# Patient Record
Sex: Male | Born: 1935 | Race: White | Hispanic: No | Marital: Married | Smoking: Former smoker
Health system: Southern US, Community
[De-identification: ages and names within clinical notes are randomized; demographics above are authoritative.]

## PROBLEM LIST (undated history)

## (undated) DIAGNOSIS — I2510 Atherosclerotic heart disease of native coronary artery without angina pectoris: Secondary | ICD-10-CM

## (undated) DIAGNOSIS — E039 Hypothyroidism, unspecified: Secondary | ICD-10-CM

## (undated) DIAGNOSIS — E119 Type 2 diabetes mellitus without complications: Secondary | ICD-10-CM

## (undated) DIAGNOSIS — N400 Enlarged prostate without lower urinary tract symptoms: Secondary | ICD-10-CM

## (undated) DIAGNOSIS — I214 Non-ST elevation (NSTEMI) myocardial infarction: Secondary | ICD-10-CM

## (undated) DIAGNOSIS — N200 Calculus of kidney: Secondary | ICD-10-CM

## (undated) DIAGNOSIS — R011 Cardiac murmur, unspecified: Secondary | ICD-10-CM

## (undated) HISTORY — PX: CORONARY ARTERY BYPASS GRAFT: SHX141

## (undated) HISTORY — PX: TONSILLECTOMY: SUR1361

---

## 2001-01-23 ENCOUNTER — Ambulatory Visit (HOSPITAL_COMMUNITY): Admission: RE | Admit: 2001-01-23 | Discharge: 2001-01-23 | Payer: Self-pay | Admitting: Ophthalmology

## 2001-01-23 ENCOUNTER — Encounter: Payer: Self-pay | Admitting: Ophthalmology

## 2021-04-14 ENCOUNTER — Emergency Department (HOSPITAL_BASED_OUTPATIENT_CLINIC_OR_DEPARTMENT_OTHER)
Admission: EM | Admit: 2021-04-14 | Discharge: 2021-04-15 | Disposition: A | Discharge status: Home / Self Care | Payer: Medicare Other | Attending: Emergency Medicine | Admitting: Emergency Medicine

## 2021-04-14 ENCOUNTER — Other Ambulatory Visit: Payer: Self-pay

## 2021-04-14 ENCOUNTER — Emergency Department (HOSPITAL_BASED_OUTPATIENT_CLINIC_OR_DEPARTMENT_OTHER): Payer: Medicare Other

## 2021-04-14 ENCOUNTER — Encounter (HOSPITAL_BASED_OUTPATIENT_CLINIC_OR_DEPARTMENT_OTHER): Payer: Self-pay | Admitting: *Deleted

## 2021-04-14 DIAGNOSIS — E119 Type 2 diabetes mellitus without complications: Secondary | ICD-10-CM | POA: Insufficient documentation

## 2021-04-14 DIAGNOSIS — R42 Dizziness and giddiness: Secondary | ICD-10-CM | POA: Insufficient documentation

## 2021-04-14 DIAGNOSIS — E039 Hypothyroidism, unspecified: Secondary | ICD-10-CM | POA: Diagnosis not present

## 2021-04-14 DIAGNOSIS — F17210 Nicotine dependence, cigarettes, uncomplicated: Secondary | ICD-10-CM | POA: Diagnosis not present

## 2021-04-14 HISTORY — DX: Non-ST elevation (NSTEMI) myocardial infarction: I21.4

## 2021-04-14 HISTORY — DX: Hypothyroidism, unspecified: E03.9

## 2021-04-14 HISTORY — DX: Cardiac murmur, unspecified: R01.1

## 2021-04-14 HISTORY — DX: Calculus of kidney: N20.0

## 2021-04-14 HISTORY — DX: Atherosclerotic heart disease of native coronary artery without angina pectoris: I25.10

## 2021-04-14 HISTORY — DX: Benign prostatic hyperplasia without lower urinary tract symptoms: N40.0

## 2021-04-14 HISTORY — DX: Type 2 diabetes mellitus without complications: E11.9

## 2021-04-14 LAB — CBC WITH DIFFERENTIAL/PLATELET
Abs Immature Granulocytes: 0.02 10*3/uL (ref 0.00–0.07)
Basophils Absolute: 0 10*3/uL (ref 0.0–0.1)
Basophils Relative: 1 %
Eosinophils Absolute: 0.1 10*3/uL (ref 0.0–0.5)
Eosinophils Relative: 2 %
HCT: 33.8 % — ABNORMAL LOW (ref 39.0–52.0)
Hemoglobin: 11.4 g/dL — ABNORMAL LOW (ref 13.0–17.0)
Immature Granulocytes: 0 %
Lymphocytes Relative: 21 %
Lymphs Abs: 1.1 10*3/uL (ref 0.7–4.0)
MCH: 30.5 pg (ref 26.0–34.0)
MCHC: 33.7 g/dL (ref 30.0–36.0)
MCV: 90.4 fL (ref 80.0–100.0)
Monocytes Absolute: 0.4 10*3/uL (ref 0.1–1.0)
Monocytes Relative: 8 %
Neutro Abs: 3.6 10*3/uL (ref 1.7–7.7)
Neutrophils Relative %: 68 %
Platelets: 175 10*3/uL (ref 150–400)
RBC: 3.74 MIL/uL — ABNORMAL LOW (ref 4.22–5.81)
RDW: 14.1 % (ref 11.5–15.5)
WBC: 5.3 10*3/uL (ref 4.0–10.5)
nRBC: 0 % (ref 0.0–0.2)

## 2021-04-14 LAB — BASIC METABOLIC PANEL
Anion gap: 7 (ref 5–15)
BUN: 40 mg/dL — ABNORMAL HIGH (ref 8–23)
CO2: 23 mmol/L (ref 22–32)
Calcium: 8.7 mg/dL — ABNORMAL LOW (ref 8.9–10.3)
Chloride: 107 mmol/L (ref 98–111)
Creatinine, Ser: 1.35 mg/dL — ABNORMAL HIGH (ref 0.61–1.24)
GFR, Estimated: 50 mL/min — ABNORMAL LOW (ref >60)
Glucose, Bld: 174 mg/dL — ABNORMAL HIGH (ref 70–99)
Potassium: 3.9 mmol/L (ref 3.5–5.1)
Sodium: 137 mmol/L (ref 135–145)

## 2021-04-14 LAB — TROPONIN I (HIGH SENSITIVITY): Troponin I (High Sensitivity): 6 ng/L (ref <18)

## 2021-04-14 LAB — D-DIMER, QUANTITATIVE: D-Dimer, Quant: 1.48 ug/mL-FEU — ABNORMAL HIGH (ref 0.00–0.50)

## 2021-04-14 MED ORDER — IOHEXOL 350 MG/ML SOLN
75.0000 mL | Freq: Once | INTRAVENOUS | Status: AC | PRN
  Administered 2021-04-14: 75 mL via INTRAVENOUS

## 2021-04-14 NOTE — ED Provider Notes (Signed)
[facility] EMERGENCY DEPARTMENT Provider Note  CSN: [id number] Arrival date & time: 04/14/21 1955    History Chief Complaint  Patient presents with   Dizziness    Steve Mcgrath is a 87 y.o. male with history of CAD s/p CABG 20+ years ago reports this evening after dinner when he was getting up be began to feel lightheaded and off balance. He does not describe room spinning. Symptoms lasted for a few minutes and then resolved. He has not felt lightheaded since then and was able to walk without assistance on arrival but still does not feel quite right. He has not had any recent fever, CP, SOB, N/V/D. He does reports a mild aching pain in his R medial upper leg a few days ago that has resolved.    Past Medical History:  Diagnosis Date   CAD (coronary artery disease)    DM (diabetes mellitus) (HCC)    Enlarged prostate    Hypothyroid    Kidney stones    MI, acute, non ST segment elevation (HCC)    Murmur     Past Surgical History:  Procedure Laterality Date   CORONARY ARTERY BYPASS GRAFT     TONSILLECTOMY      No family history on file.  Social History   Tobacco Use   Smoking status: Former    Types: Cigarettes  Substance Use Topics   Alcohol use: Never     Home Medications Prior to Admission medications   Not on File     Allergies    Patient has no known allergies.   Review of Systems   Review of Systems A comprehensive review of systems was completed and negative except as noted in HPI.    Physical Exam BP (!) 135/48   Pulse 66   Temp 98.3 F (36.8 C) (Oral)   Resp (!) 23   Ht 5\' 6"  (1.676 m)   Wt 63.5 kg   SpO2 95%   BMI 22.60 kg/m   Physical Exam Vitals and nursing note reviewed.  Constitutional:      Appearance: Normal appearance.  HENT:     Head: Normocephalic and atraumatic.     Nose: Nose normal.     Mouth/Throat:     Mouth: Mucous membranes are moist.  Eyes:     Extraocular Movements: Extraocular movements intact.      Conjunctiva/sclera: Conjunctivae normal.     Comments: No nystagmus  Cardiovascular:     Rate and Rhythm: Normal rate.  Pulmonary:     Effort: Pulmonary effort is normal.     Breath sounds: Normal breath sounds.  Abdominal:     General: Abdomen is flat.     Palpations: Abdomen is soft.     Tenderness: There is no abdominal tenderness.  Musculoskeletal:        General: No swelling. Normal range of motion.     Cervical back: Neck supple.     Right lower leg: No edema.     Left lower leg: No edema.  Skin:    General: Skin is warm and dry.  Neurological:     General: No focal deficit present.     Mental Status: He is alert and oriented to person, place, and time.     Cranial Nerves: No cranial nerve deficit.     Sensory: No sensory deficit.     Motor: No weakness.     Coordination: Coordination normal.     Gait: Gait normal.  Psychiatric:  Mood and Affect: Mood normal.     ED Results / Procedures / Treatments   Labs (all labs ordered are listed, but only abnormal results are displayed) Labs Reviewed  BASIC METABOLIC PANEL - Abnormal; Notable for the following components:      Result Value   Glucose, Bld 174 (*)    BUN 40 (*)    Creatinine, Ser 1.35 (*)    Calcium 8.7 (*)    GFR, Estimated 50 (*)    All other components within normal limits  CBC WITH DIFFERENTIAL/PLATELET - Abnormal; Notable for the following components:   RBC 3.74 (*)    Hemoglobin 11.4 (*)    HCT 33.8 (*)    All other components within normal limits  D-DIMER, QUANTITATIVE - Abnormal; Notable for the following components:   D-Dimer, Quant 1.48 (*)    All other components within normal limits  URINALYSIS, ROUTINE W REFLEX MICROSCOPIC  TROPONIN I (HIGH SENSITIVITY)  TROPONIN I (HIGH SENSITIVITY)    EKG EKG Interpretation  Date/Time:  Tuesday April 14 2021 21:58:32 EDT Ventricular Rate:  60 PR Interval:  167 QRS Duration: 108 QT Interval:  418 QTC Calculation: 418 R  Axis:   71 Text Interpretation: Sinus rhythm Minimal ST depression, lateral leads No significant change since last tracing Confirmed by Susy Frizzle [phone]) on 04/14/2021 10:39:35 PM   Radiology CT HEAD WO CONTRAST ( )  Result Date: 04/14/2021 CLINICAL DATA:  Dizziness EXAM: CT HEAD WITHOUT CONTRAST TECHNIQUE: Contiguous axial images were obtained from the base of the skull through the vertex without intravenous contrast. COMPARISON:  None. FINDINGS: Brain: No evidence of acute infarction, hemorrhage, hydrocephalus, extra-axial collection or mass lesion/mass effect. Mild cortical atrophy. Subcortical white matter and periventricular small vessel ischemic changes. Vascular: Intracranial atherosclerosis. Skull: Normal. Negative for fracture or focal lesion. Sinuses/Orbits: The visualized paranasal sinuses are essentially clear. The mastoid air cells are unopacified. Other: None. IMPRESSION: No evidence of acute intracranial abnormality. Atrophy with small vessel ischemic changes. Electronically Signed   By: Charline Bills M.D.   On: 04/14/2021 20:42   CT Angio Chest PE W/Cm &/Or Wo Cm  Result Date: 04/14/2021 CLINICAL DATA:  Positive D-dimer, dizziness and weakness. EXAM: CT ANGIOGRAPHY CHEST WITH CONTRAST TECHNIQUE: Multidetector CT imaging of the chest was performed using the standard protocol during bolus administration of intravenous contrast. Multiplanar CT image reconstructions and MIPs were obtained to evaluate the vascular anatomy. CONTRAST:  4mL OMNIPAQUE IOHEXOL 350 MG/ML SOLN COMPARISON:  None. FINDINGS: Cardiovascular: Satisfactory opacification of the pulmonary arteries to the segmental level. No evidence of pulmonary embolism. The heart is mildly enlarged. There is no pericardial effusion. Patient is status post cardiac surgery. The thoracic aorta is ectatic. There is calcified atherosclerotic disease throughout the aorta. Mediastinum/Nodes: There are no enlarged mediastinal or hilar  lymph nodes identified. There is a moderate-sized hiatal hernia. The visualized esophagus is nondilated. Lungs/Pleura: There are mild emphysematous changes bilaterally. There is pleuroparenchymal scarring in both lung apices. There is no focal lung infiltrate. There is no pleural effusion or pneumothorax. Upper Abdomen: No acute abnormality. Musculoskeletal: Sternotomy wires are present. Degenerative changes affect the spine. Review of the MIP images confirms the above findings. IMPRESSION: 1. No evidence for pulmonary embolism. 2. Mild cardiomegaly. 3. Moderate-sized hiatal hernia. 4. Aortic Atherosclerosis (ICD10-I70.0) and Emphysema (ICD10-J43.9). Electronically Signed   By: Darliss Cheney M.D.   On: 04/14/2021 23:22   US Venous Img Lower Right (DVT Study)  Result Date: 04/14/2021 CLINICAL DATA:  Right leg pain.  EXAM: RIGHT LOWER EXTREMITY VENOUS DOPPLER ULTRASOUND TECHNIQUE: Gray-scale sonography with compression, as well as color and duplex ultrasound, were performed to evaluate the deep venous system(s) from the level of the common femoral vein through the popliteal and proximal calf veins. COMPARISON:  None. FINDINGS: VENOUS Normal compressibility of the common femoral, superficial femoral, and popliteal veins, as well as the visualized calf veins. Visualized portions of profunda femoral vein and great saphenous vein unremarkable. No filling defects to suggest DVT on grayscale or color Doppler imaging. Doppler waveforms show normal direction of venous flow, normal respiratory plasticity and response to augmentation. Limited views of the contralateral common femoral vein are unremarkable. OTHER None. Limitations: none IMPRESSION: Negative. Electronically Signed   By: Aram Candela M.D.   On: 04/14/2021 23:32    Procedures Procedures  Medications Ordered in the ED Medications  iohexol (OMNIPAQUE) 350 MG/ML injection 75 mL (75 mLs Intravenous Contrast Given 04/14/21 2255)     MDM  Rules/Calculators/A&P MDM Patient with unusual dizziness/lightheadedness. Not clearly vertiginous and has essentially resolved now. He does not have any focal neuro findings. Will check labs, send for CT and reassess.   ED Course  I have reviewed the triage vital signs and the nursing notes.  Pertinent labs & imaging results that were available during my care of the patient were reviewed by me and considered in my medical decision making (see chart for details).  Clinical Course as of 04/14/21 2335  Tue Apr 14, 2021  2137 CT head is negative.  [CS]  2224 CBC is unremarkable.  [CS]  2225 BMP is unremarkable. First Trop neg [CS]  2225 Age adjusted d-dimer is elevated. Given R thigh pain and sudden dizziness today, will check [place] and CTA. Patient and family at bedside amenable to plan.  [CS]  2325 CTA is neg for PE.  [CS]  2330 Doppler is pending. Patient is eager to go home if it is negative for DVT. He has remained asymptomatic and is not interested in admission. He understands that his dizziness could be from a central source and that if his symptoms return he should come back for reassessment.  [CS]    Clinical Course User Index [CS] Pollyann Savoy, MD    Final Clinical Impression(s) / ED Diagnoses Final diagnoses:  Dizziness    Rx / DC Orders ED Discharge Orders     None        Pollyann Savoy, MD 04/14/21 2335

## 2021-04-14 NOTE — ED Notes (Signed)
Pt laid flat for orthostatic vitals at [phone]

## 2021-04-14 NOTE — ED Triage Notes (Addendum)
Sudden onset of dizziness and weakness x 1 hr ago , wife denies slurred speech or unsteady gait , pt reports dizziness and nausea with movt

## 2021-08-03 ENCOUNTER — Other Ambulatory Visit (HOSPITAL_BASED_OUTPATIENT_CLINIC_OR_DEPARTMENT_OTHER): Payer: Self-pay

## 2021-08-03 ENCOUNTER — Emergency Department (HOSPITAL_BASED_OUTPATIENT_CLINIC_OR_DEPARTMENT_OTHER)
Admission: EM | Admit: 2021-08-03 | Discharge: 2021-08-03 | Disposition: A | Discharge status: Home / Self Care | Payer: Medicare Other | Attending: Emergency Medicine | Admitting: Emergency Medicine

## 2021-08-03 ENCOUNTER — Other Ambulatory Visit: Payer: Self-pay

## 2021-08-03 ENCOUNTER — Encounter (HOSPITAL_BASED_OUTPATIENT_CLINIC_OR_DEPARTMENT_OTHER): Payer: Self-pay

## 2021-08-03 DIAGNOSIS — U071 COVID-19: Secondary | ICD-10-CM

## 2021-08-03 DIAGNOSIS — R059 Cough, unspecified: Secondary | ICD-10-CM | POA: Diagnosis present

## 2021-08-03 LAB — RESP PANEL BY RT-PCR (FLU A&B, COVID) ARPGX2
Influenza A by PCR: NEGATIVE
Influenza B by PCR: NEGATIVE
SARS Coronavirus 2 by RT PCR: POSITIVE — AB

## 2021-08-03 MED ORDER — CARESTART COVID-19 HOME TEST VI KIT
PACK | 0 refills | Status: DC
  Filled 2021-08-03: qty 4, 4d supply, fill #0

## 2021-08-03 MED ORDER — MOLNUPIRAVIR 200 MG PO CAPS
ORAL_CAPSULE | ORAL | 0 refills | Status: DC
  Filled 2021-08-03: qty 40, 5d supply, fill #0

## 2021-08-03 MED ORDER — MOLNUPIRAVIR EUA 200MG CAPSULE
4.0000 | ORAL_CAPSULE | Freq: Two times a day (BID) | ORAL | 0 refills | Status: AC
  Filled 2021-08-03: qty 40, 5d supply, fill #0

## 2021-08-03 NOTE — Discharge Instructions (Addendum)
It was our pleasure to provide your ER care today - we hope that you feel better.  Your covid test is positive - see attached info.  Drink plenty of fluids/stay well hydrated. Stay active, take full and deep breaths. Take acetaminophen or ibuprofen as need for fever or body aches. Take molnupiravir as prescribed.   Follow up with primary care doctor in two weeks if symptoms fail to improve/resolve.  Return to ER if worse, new symptoms, increased trouble breathing, or other emergency concern.

## 2021-08-03 NOTE — ED Triage Notes (Signed)
Pt c/o fever 89f, chills, nausea, cough since yesterday. Last tylenol last night.

## 2021-08-03 NOTE — ED Provider Notes (Signed)
[facility] EMERGENCY DEPARTMENT Provider Note   CSN: [id number] Arrival date & time: 08/03/21  2751     History  Chief Complaint  Patient presents with   Fever    MADDYX WIECK is a 88 y.o. male.  Patient c/o non prod cough, fever, congestion, since yesterday. Symptoms acute  onset, moderate, persistent. No chest pain or discomfort. No sob. No sore throat or trouble swallowing. No severe headaches. No neck pain or stiffness. No abd pain or nvd. No gu c/o. Is eating/drinking. No specific known ill contacts. Has had 3 covid shots.   The history is provided by the patient, a relative and medical records.  Fever Associated symptoms: congestion and cough   Associated symptoms: no chest pain, no confusion, no headaches, no rash and no sore throat       Home Medications Prior to Admission medications   Not on File      Allergies    Patient has no known allergies.    Review of Systems   Review of Systems  Constitutional:  Positive for fever.  HENT:  Positive for congestion. Negative for sore throat.   Eyes:  Negative for redness.  Respiratory:  Positive for cough. Negative for shortness of breath.   Cardiovascular:  Negative for chest pain and leg swelling.  Gastrointestinal:  Negative for abdominal pain.  Genitourinary:  Negative for flank pain.  Musculoskeletal:  Negative for neck pain and neck stiffness.  Skin:  Negative for rash.  Neurological:  Negative for headaches.  Hematological:  Does not bruise/bleed easily.  Psychiatric/Behavioral:  Negative for confusion.    Physical Exam Updated Vital Signs BP (!) 140/53 (BP Location: Right Arm)    Pulse 69    Temp 99.3 F (37.4 C) (Oral)    Resp 20    Ht 1.626 m (5\' 4" )    Wt 62.6 kg    SpO2 95%    BMI 23.69 kg/m  Physical Exam Vitals and nursing note reviewed.  Constitutional:      Appearance: Normal appearance. He is well-developed.  HENT:     Head: Atraumatic.     Nose: Congestion present.      Mouth/Throat:     Mouth: Mucous membranes are moist.     Pharynx: Oropharynx is clear.  Eyes:     General: No scleral icterus.    Conjunctiva/sclera: Conjunctivae normal.  Neck:     Trachea: No tracheal deviation.     Comments: No stiffness or rigidity Cardiovascular:     Rate and Rhythm: Normal rate and regular rhythm.     Pulses: Normal pulses.     Heart sounds: Normal heart sounds. No murmur heard.   No friction rub. No gallop.  Pulmonary:     Effort: Pulmonary effort is normal. No accessory muscle usage or respiratory distress.     Breath sounds: Normal breath sounds.  Abdominal:     General: Bowel sounds are normal. There is no distension.     Palpations: Abdomen is soft.     Tenderness: There is no abdominal tenderness.  Genitourinary:    Comments: No cva tenderness. Musculoskeletal:        General: No swelling.     Cervical back: Normal range of motion and neck supple. No rigidity.  Skin:    General: Skin is warm and dry.     Findings: No rash.  Neurological:     Mental Status: He is alert.     Comments: Alert, speech clear.  Psychiatric:        Mood and Affect: Mood normal.    ED Results / Procedures / Treatments   Labs (all labs ordered are listed, but only abnormal results are displayed) Labs Reviewed  RESP PANEL BY RT-PCR (FLU A&B, COVID) ARPGX2 - Abnormal; Notable for the following components:      Result Value   SARS Coronavirus 2 by RT PCR POSITIVE (*)    All other components within normal limits    EKG None  Radiology No results found.  Procedures Procedures    Medications Ordered in ED Medications - No data to display  ED Course/ Medical Decision Making/ A&P                           Medical Decision Making  Labs sent.   Reviewed nursing notes and prior charts for additional history. External reports reviewed. Additional hx from family.  Labs  reviewed/interpreted by me - covid positive.   LENTON GENDREAU was evaluated in Emergency  Department on 08/03/2021 for the symptoms described in the history of present illness. He was evaluated in the context of the global COVID-19 pandemic, which necessitated consideration that the patient might be at risk for infection with the SARS-CoV-2 virus that causes COVID-19. Institutional protocols and algorithms that pertain to the evaluation of patients at risk for COVID-19 are in a state of rapid change based on information released by regulatory bodies including the CDC and federal and state organizations. These policies and algorithms were followed during the patient's care in the ED.  Pt is breathing comfortably, pulse ox 95% room air. Has been vaccinated w booster x 1.    Will give rx monupiravir.   Return precautions provided.           Final Clinical Impression(s) / ED Diagnoses Final diagnoses:  None    Rx / DC Orders ED Discharge Orders     None         Cathren Laine, MD 08/03/21 1231

## 2022-11-22 ENCOUNTER — Encounter (HOSPITAL_BASED_OUTPATIENT_CLINIC_OR_DEPARTMENT_OTHER): Payer: Self-pay | Admitting: Urology

## 2022-11-22 ENCOUNTER — Other Ambulatory Visit: Payer: Self-pay

## 2022-11-22 ENCOUNTER — Emergency Department (HOSPITAL_BASED_OUTPATIENT_CLINIC_OR_DEPARTMENT_OTHER): Payer: Medicare Other

## 2022-11-22 ENCOUNTER — Inpatient Hospital Stay (HOSPITAL_BASED_OUTPATIENT_CLINIC_OR_DEPARTMENT_OTHER)
Admission: EM | Admit: 2022-11-22 | Discharge: 2022-11-29 | DRG: 981 | Disposition: A | Discharge status: Home Health | Payer: Medicare Other | Attending: Internal Medicine | Admitting: Internal Medicine

## 2022-11-22 DIAGNOSIS — I350 Nonrheumatic aortic (valve) stenosis: Secondary | ICD-10-CM | POA: Diagnosis present

## 2022-11-22 DIAGNOSIS — H3530 Unspecified macular degeneration: Secondary | ICD-10-CM | POA: Diagnosis present

## 2022-11-22 DIAGNOSIS — R011 Cardiac murmur, unspecified: Secondary | ICD-10-CM | POA: Diagnosis present

## 2022-11-22 DIAGNOSIS — E878 Other disorders of electrolyte and fluid balance, not elsewhere classified: Secondary | ICD-10-CM | POA: Diagnosis present

## 2022-11-22 DIAGNOSIS — Z79890 Hormone replacement therapy (postmenopausal): Secondary | ICD-10-CM

## 2022-11-22 DIAGNOSIS — E785 Hyperlipidemia, unspecified: Secondary | ICD-10-CM | POA: Diagnosis present

## 2022-11-22 DIAGNOSIS — Z7982 Long term (current) use of aspirin: Secondary | ICD-10-CM

## 2022-11-22 DIAGNOSIS — D62 Acute posthemorrhagic anemia: Secondary | ICD-10-CM | POA: Diagnosis not present

## 2022-11-22 DIAGNOSIS — Z79899 Other long term (current) drug therapy: Secondary | ICD-10-CM

## 2022-11-22 DIAGNOSIS — N1831 Chronic kidney disease, stage 3a: Secondary | ICD-10-CM | POA: Diagnosis present

## 2022-11-22 DIAGNOSIS — R29818 Other symptoms and signs involving the nervous system: Secondary | ICD-10-CM

## 2022-11-22 DIAGNOSIS — K219 Gastro-esophageal reflux disease without esophagitis: Secondary | ICD-10-CM | POA: Diagnosis present

## 2022-11-22 DIAGNOSIS — G459 Transient cerebral ischemic attack, unspecified: Secondary | ICD-10-CM | POA: Diagnosis not present

## 2022-11-22 DIAGNOSIS — Y838 Other surgical procedures as the cause of abnormal reaction of the patient, or of later complication, without mention of misadventure at the time of the procedure: Secondary | ICD-10-CM | POA: Diagnosis not present

## 2022-11-22 DIAGNOSIS — I129 Hypertensive chronic kidney disease with stage 1 through stage 4 chronic kidney disease, or unspecified chronic kidney disease: Secondary | ICD-10-CM | POA: Diagnosis present

## 2022-11-22 DIAGNOSIS — Z87442 Personal history of urinary calculi: Secondary | ICD-10-CM

## 2022-11-22 DIAGNOSIS — Z87891 Personal history of nicotine dependence: Secondary | ICD-10-CM

## 2022-11-22 DIAGNOSIS — I252 Old myocardial infarction: Secondary | ICD-10-CM

## 2022-11-22 DIAGNOSIS — J439 Emphysema, unspecified: Secondary | ICD-10-CM | POA: Diagnosis present

## 2022-11-22 DIAGNOSIS — G453 Amaurosis fugax: Secondary | ICD-10-CM | POA: Diagnosis present

## 2022-11-22 DIAGNOSIS — Z7983 Long term (current) use of bisphosphonates: Secondary | ICD-10-CM

## 2022-11-22 DIAGNOSIS — I708 Atherosclerosis of other arteries: Secondary | ICD-10-CM | POA: Diagnosis present

## 2022-11-22 DIAGNOSIS — E039 Hypothyroidism, unspecified: Secondary | ICD-10-CM | POA: Diagnosis present

## 2022-11-22 DIAGNOSIS — H3411 Central retinal artery occlusion, right eye: Secondary | ICD-10-CM

## 2022-11-22 DIAGNOSIS — E872 Acidosis, unspecified: Secondary | ICD-10-CM | POA: Diagnosis not present

## 2022-11-22 DIAGNOSIS — I700 Atherosclerosis of aorta: Secondary | ICD-10-CM | POA: Diagnosis present

## 2022-11-22 DIAGNOSIS — H539 Unspecified visual disturbance: Secondary | ICD-10-CM

## 2022-11-22 DIAGNOSIS — I2510 Atherosclerotic heart disease of native coronary artery without angina pectoris: Secondary | ICD-10-CM | POA: Diagnosis present

## 2022-11-22 DIAGNOSIS — E1151 Type 2 diabetes mellitus with diabetic peripheral angiopathy without gangrene: Secondary | ICD-10-CM | POA: Diagnosis present

## 2022-11-22 DIAGNOSIS — E1142 Type 2 diabetes mellitus with diabetic polyneuropathy: Secondary | ICD-10-CM | POA: Diagnosis present

## 2022-11-22 DIAGNOSIS — N400 Enlarged prostate without lower urinary tract symptoms: Secondary | ICD-10-CM | POA: Diagnosis present

## 2022-11-22 DIAGNOSIS — E1122 Type 2 diabetes mellitus with diabetic chronic kidney disease: Secondary | ICD-10-CM | POA: Diagnosis present

## 2022-11-22 DIAGNOSIS — R578 Other shock: Secondary | ICD-10-CM | POA: Diagnosis not present

## 2022-11-22 DIAGNOSIS — R29701 NIHSS score 1: Secondary | ICD-10-CM | POA: Diagnosis present

## 2022-11-22 DIAGNOSIS — I97618 Postprocedural hemorrhage and hematoma of a circulatory system organ or structure following other circulatory system procedure: Secondary | ICD-10-CM | POA: Diagnosis not present

## 2022-11-22 DIAGNOSIS — Z951 Presence of aortocoronary bypass graft: Secondary | ICD-10-CM

## 2022-11-22 LAB — CBC WITH DIFFERENTIAL/PLATELET
Abs Immature Granulocytes: 0.02 10*3/uL (ref 0.00–0.07)
Basophils Absolute: 0 10*3/uL (ref 0.0–0.1)
Basophils Relative: 1 %
Eosinophils Absolute: 0.1 10*3/uL (ref 0.0–0.5)
Eosinophils Relative: 2 %
HCT: 35.2 % — ABNORMAL LOW (ref 39.0–52.0)
Hemoglobin: 11.8 g/dL — ABNORMAL LOW (ref 13.0–17.0)
Immature Granulocytes: 0 %
Lymphocytes Relative: 30 %
Lymphs Abs: 2 10*3/uL (ref 0.7–4.0)
MCH: 30.2 pg (ref 26.0–34.0)
MCHC: 33.5 g/dL (ref 30.0–36.0)
MCV: 90 fL (ref 80.0–100.0)
Monocytes Absolute: 0.6 10*3/uL (ref 0.1–1.0)
Monocytes Relative: 9 %
Neutro Abs: 3.9 10*3/uL (ref 1.7–7.7)
Neutrophils Relative %: 58 %
Platelets: 175 10*3/uL (ref 150–400)
RBC: 3.91 MIL/uL — ABNORMAL LOW (ref 4.22–5.81)
RDW: 13.8 % (ref 11.5–15.5)
WBC: 6.6 10*3/uL (ref 4.0–10.5)
nRBC: 0 % (ref 0.0–0.2)

## 2022-11-22 LAB — COMPREHENSIVE METABOLIC PANEL
ALT: 14 U/L (ref 0–44)
AST: 18 U/L (ref 15–41)
Albumin: 3.9 g/dL (ref 3.5–5.0)
Alkaline Phosphatase: 64 U/L (ref 38–126)
Anion gap: 8 (ref 5–15)
BUN: 38 mg/dL — ABNORMAL HIGH (ref 8–23)
CO2: 23 mmol/L (ref 22–32)
Calcium: 8.9 mg/dL (ref 8.9–10.3)
Chloride: 106 mmol/L (ref 98–111)
Creatinine, Ser: 1.36 mg/dL — ABNORMAL HIGH (ref 0.61–1.24)
GFR, Estimated: 49 mL/min — ABNORMAL LOW (ref >60)
Glucose, Bld: 129 mg/dL — ABNORMAL HIGH (ref 70–99)
Potassium: 4.2 mmol/L (ref 3.5–5.1)
Sodium: 137 mmol/L (ref 135–145)
Total Bilirubin: 0.4 mg/dL (ref 0.3–1.2)
Total Protein: 7 g/dL (ref 6.5–8.1)

## 2022-11-22 LAB — APTT: aPTT: 32 seconds (ref 24–36)

## 2022-11-22 LAB — PROTIME-INR
INR: 1.1 (ref 0.8–1.2)
Prothrombin Time: 13.7 seconds (ref 11.4–15.2)

## 2022-11-22 LAB — SEDIMENTATION RATE: Sed Rate: 24 mm/hr — ABNORMAL HIGH (ref 0–16)

## 2022-11-22 LAB — ETHANOL: Alcohol, Ethyl (B): <10 mg/dL (ref <10)

## 2022-11-22 MED ORDER — SODIUM CHLORIDE 0.9% FLUSH
3.0000 mL | Freq: Once | INTRAVENOUS | Status: AC
  Administered 2022-11-22: 3 mL via INTRAVENOUS
  Filled 2022-11-22: qty 3

## 2022-11-22 MED ORDER — IOHEXOL 350 MG/ML SOLN
75.0000 mL | Freq: Once | INTRAVENOUS | Status: AC | PRN
  Administered 2022-11-22: 75 mL via INTRAVENOUS

## 2022-11-22 MED ORDER — CLOPIDOGREL BISULFATE 300 MG PO TABS
300.0000 mg | ORAL_TABLET | Freq: Once | ORAL | Status: AC
  Administered 2022-11-22: 300 mg via ORAL
  Filled 2022-11-22: qty 1

## 2022-11-22 NOTE — ED Provider Notes (Signed)
[place] [facility] Provider Note   CSN: [id number] Arrival date & time: 11/22/22  1550     History  Chief Complaint  Patient presents with   Loss of Vision    Steve Mcgrath is a 89 y.o. male.  The history is provided by the patient and medical records. No language interpreter was used.  Neurologic Problem This is a new problem. The current episode started more than 2 days ago. The problem occurs rarely. The problem has been resolved. Pertinent negatives include no chest pain, no abdominal pain, no headaches and no shortness of breath. Nothing aggravates the symptoms. Nothing relieves the symptoms. He has tried nothing for the symptoms. The treatment provided no relief.       Home Medications Prior to Admission medications   Medication Sig Start Date End Date Taking? Authorizing Provider  COVID-19 At Home Antigen Test [facility] COVID-19 HOME TEST) KIT Use as directed 08/03/21   Gwenlyn Fudge, [facility]  COVID-19 At Home Antigen Test [facility] COVID-19 HOME TEST) KIT Use as directed 08/03/21   Gwenlyn Fudge, [facility]  molnupiravir EUA (LAGEVRIO) 200 MG CAPS capsule Take 4 capsules by mouth twice daily for 5 days 08/03/21   Cathren Laine, MD      Allergies    Patient has no known allergies.    Review of Systems   Review of Systems  Constitutional:  Negative for chills, diaphoresis, fatigue and fever.  HENT:  Negative for congestion.   Eyes:  Positive for visual disturbance (resolved now). Negative for pain and discharge.  Respiratory:  Negative for cough, chest tightness, shortness of breath and wheezing.   Cardiovascular:  Negative for chest pain, palpitations and leg swelling.  Gastrointestinal:  Negative for abdominal pain, constipation, diarrhea, nausea and vomiting.  Genitourinary:  Negative for dysuria, flank pain and frequency.  Musculoskeletal:  Negative for back pain, neck pain and neck stiffness.  Skin:  Negative for rash and wound.   Neurological:  Negative for dizziness, syncope, speech difficulty, weakness, light-headedness, numbness and headaches.  Psychiatric/Behavioral:  Negative for agitation and confusion.   All other systems reviewed and are negative.   Physical Exam Updated Vital Signs BP (!) 171/49 (BP Location: Right Arm)   Pulse (!) 59   Temp 97.7 F (36.5 C) (Oral)   Resp 20   Ht  (1.626 m)   Wt 62.6 kg   SpO2 96%   BMI 23.69 kg/m  Physical Exam Vitals and nursing note reviewed.  Constitutional:      General: He is not in acute distress.    Appearance: He is well-developed. He is not ill-appearing, toxic-appearing or diaphoretic.  HENT:     Head: Normocephalic and atraumatic.     Nose: No congestion or rhinorrhea.     Mouth/Throat:     Pharynx: No oropharyngeal exudate or posterior oropharyngeal erythema.  Eyes:     Extraocular Movements: Extraocular movements intact.     Right eye: Normal extraocular motion.     Left eye: Normal extraocular motion.     Conjunctiva/sclera: Conjunctivae normal.     Comments: Right pupil is dilated and left does not.  Likely due to the dilated eye exam he just came from the eye doctor.  Was told he had a reassuring eye exam by ophthalmology.  Neck:     Vascular: No carotid bruit.  Cardiovascular:     Rate and Rhythm: Normal rate and regular rhythm.     Heart sounds:  Murmur heard.  Pulmonary:     Effort: Pulmonary effort is normal. No respiratory distress.     Breath sounds: Normal breath sounds. No wheezing, rhonchi or rales.  Chest:     Chest wall: No tenderness.  Abdominal:     Palpations: Abdomen is soft.     Tenderness: There is no abdominal tenderness. There is no guarding or rebound.  Musculoskeletal:        General: No swelling.     Cervical back: Neck supple. No tenderness.  Skin:    General: Skin is warm and dry.     Capillary Refill: Capillary refill takes less than 2 seconds.     Findings: No erythema or rash.  Neurological:      General: No focal deficit present.     Mental Status: He is alert.     Cranial Nerves: No cranial nerve deficit.     Sensory: No sensory deficit.     Motor: No weakness.     Coordination: Coordination normal.  Psychiatric:        Mood and Affect: Mood normal.     ED Results / Procedures / Treatments   Labs (all labs ordered are listed, but only abnormal results are displayed) Labs Reviewed  CBC WITH DIFFERENTIAL/PLATELET - Abnormal; Notable for the following components:      Result Value   RBC 3.91 (*)    Hemoglobin 11.8 (*)    HCT 35.2 (*)    All other components within normal limits  COMPREHENSIVE METABOLIC PANEL - Abnormal; Notable for the following components:   Glucose, Bld 129 (*)    BUN 38 (*)    Creatinine, Ser 1.36 (*)    GFR, Estimated 49 (*)    All other components within normal limits  SEDIMENTATION RATE - Abnormal; Notable for the following components:   Sed Rate 24 (*)    All other components within normal limits  PROTIME-INR  APTT  ETHANOL  C-REACTIVE PROTEIN  CBG MONITORING, ED    EKG EKG Interpretation  Date/Time:  Monday November 22 2022 16:03:03 EDT Ventricular Rate:  55 PR Interval:  148 QRS Duration: 101 QT Interval:  418 QTC Calculation: 400 R Axis:   72 Text Interpretation: Sinus rhythm Borderline repol abnrm, inferolateral leads when compared to prior, new t wave inversion in lead 3. M No STEMI Confirmed by Theda Belfast ([id number]) on 11/22/2022 4:30:30 PM  Radiology CT ANGIO HEAD NECK W WO CM  Result Date: 11/22/2022 CLINICAL DATA:  Transient vision loss in right eye on Friday EXAM: CT ANGIOGRAPHY HEAD AND NECK WITH AND WITHOUT CONTRAST TECHNIQUE: Multidetector CT imaging of the head and neck was performed using the standard protocol during bolus administration of intravenous contrast. Multiplanar CT image reconstructions and MIPs were obtained to evaluate the vascular anatomy. Carotid stenosis measurements (when applicable) are obtained  utilizing NASCET criteria, using the distal internal carotid diameter as the denominator. RADIATION DOSE REDUCTION: This exam was performed according to the departmental dose-optimization program which includes automated exposure control, adjustment of the mA and/or kV according to patient size and/or use of iterative reconstruction technique. CONTRAST:  75mL OMNIPAQUE IOHEXOL 350 MG/ML SOLN COMPARISON:  No prior CTA available, correlation is made with 04/14/2021 CT head FINDINGS: CT HEAD FINDINGS Brain: No evidence of acute infarct, hemorrhage, mass, mass effect, or midline shift. No hydrocephalus or extra-axial fluid collection. Periventricular white matter changes, likely the sequela of chronic small vessel ischemic disease. Remote lacunar infarcts in the bilateral basal ganglia. Normal  cerebral volume for age. Vascular: No hyperdense vessel. Skull: Negative for fracture or focal lesion. Sinuses/Orbits: Mucosal thickening in the ethmoid air cells. Status post bilateral lens replacements. Other: The mastoid air cells are well aerated. CTA NECK FINDINGS Aortic arch: Standard branching. Imaged portion shows no evidence of aneurysm or dissection. Aortic atherosclerosis. 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 Atherosclerotic plaque at the origin of the left common carotid and left subclavian arteries is not hemodynamically significant. Right carotid system: No evidence of dissection, occlusion, or hemodynamically significant stenosis (greater than 50%). Left carotid system: No evidence of dissection, occlusion, or hemodynamically significant stenosis (greater than 50%). Vertebral arteries: Moderate stenosis at the origin of the right vertebral artery. No other significant stenosis in the vertebral arteries. No evidence of dissection. Skeleton: No acute osseous abnormality. Degenerative changes in the cervical spine. Other  neck: Negative. Upper chest: No focal pulmonary opacity or pleural effusion. Apical pleural-parenchymal scarring. Paraseptal emphysema. Review of the MIP images confirms the above findings CTA HEAD FINDINGS Anterior circulation: Both internal carotid arteries are patent to the termini, with mild stenosis in the bilateral supraclinoid segments. A1 segments patent, somewhat hypoplastic on the right. Normal anterior communicating artery. Anterior cerebral arteries are patent to their distal aspects without significant stenosis. No M1 stenosis or occlusion. MCA branches perfused to their distal aspects without significant stenosis. Posterior circulation: Vertebral arteries patent to the vertebrobasilar junction without significant stenosis. Posterior inferior cerebellar arteries patent proximally. Basilar patent to its distal aspect without significant stenosis. Superior cerebellar arteries patent proximally. Patent right P1. Fetal origin of the left PCA from the left posterior communicating artery. PCAs perfused to their distal aspects without significant stenosis. Venous sinuses: As permitted by contrast timing, patent. Anatomic variants: Fetal origin of the left PCA. Review of the MIP images confirms the above findings IMPRESSION: 1. No acute intracranial process. 2. 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 3. Moderate stenosis at the origin of the right vertebral artery. No other hemodynamically significant stenosis in the neck. 4. No intracranial large vessel occlusion or significant stenosis. 5. Aortic atherosclerosis. 6. Emphysema. Aortic Atherosclerosis (ICD10-I70.0) and Emphysema (ICD10-J43.9). Electronically Signed   By: Wiliam Ke M.D.   On: 11/22/2022 19:12    Procedures Procedures    Medications Ordered in ED Medications  clopidogrel (PLAVIX) tablet 300 mg (has no administration in time range)  sodium chloride flush (NS) 0.9 % injection 3 mL (3 mLs  Intravenous Given 11/22/22 1714)  iohexol (OMNIPAQUE) 350 MG/ML injection 75 mL (75 mLs Intravenous Contrast Given 11/22/22 1823)    ED Course/ Medical Decision Making/ A&P                             Medical Decision Making Amount and/or Complexity of Data Reviewed Labs: ordered. Radiology: ordered.  Risk Prescription drug management. Decision regarding hospitalization.    Steve Mcgrath is a 89 y.o. male With a past medical history significant for CAD status post CABG, hypothyroidism, diabetes, and kidney stones who presents at the direction of PCP for further evaluation of transient vision changes.  According to patient, since Friday, he has had abnormal right eye vision waxing and waning.  He reports is never had this before but has some chronic vision difficulties.  He reports that on Friday his vision turned dark and  blurry all over aside from a small area in the middle.  He said that it has waxed and waned and is now back to his baseline.  He denies any nausea, vomiting, speech difficulties, or extremity symptoms.  Denies any numbness, tingling, weakness of extremities.  Denies dizziness.  Denies other neurologic deficits at this time.  He reports no headache and no temporal pain but reports that he felt something different in his head.  He denies any nausea, vomiting, or vertigo.  Denies history of this but has a strong family history of strokes.  He called me an appointment with his eye doctor and was seen today where he had a dilated eye exam and then was told he likely had a stroke and needs to go to the emergency department for evaluation.  On exam today, lungs clear.  Chest nontender.  He has a loud murmur but I could not appreciate a carotid bruit initially.  Intact sensation, strength, and pulse in extremities.  Symmetric smile.  No temporal tenderness.  Pupils dilated on the right and on the left likely from the exam.  Extraocular movements intact.  Intact finger-nose-finger  testing in both arms.  Good pulses.  Clinically I am concerned about TIA or emesis fugax.  We will get CTA head and neck and then I will touch base with the neurology team.  Anticipate transfer for MRI tonight for further management.  7:33 PM CTA returned without evidence of acute stroke.  It did show different stenoses but no LVO.  No dissection.  Spoke to Dr. Selina Cooley with neurology who recommends getting ESR/CRP added, ordering a noncontrast MRI of the brain, and admission to [facility] for further management and evaluation of likely TIA.  Patient reports he is on aspirin daily, will discuss if he needs Plavix.  Will call medicine for admission.         Final Clinical Impression(s) / ED Diagnoses Final diagnoses:  Transient neurological symptoms  Transient vision disturbance of right eye    Clinical Impression: 1. Transient neurological symptoms   2. Transient vision disturbance of right eye     Disposition: Admit  This note was prepared with assistance of Dragon voice recognition software. Occasional wrong-word or sound-a-like substitutions may have occurred due to the inherent limitations of voice recognition software.      Wannetta Langland, Canary Brim, MD 11/22/22 2232

## 2022-11-22 NOTE — ED Provider Triage Note (Signed)
Emergency Medicine Provider Triage Evaluation Note  Steve Mcgrath , a 89 y.o. male  was evaluated in triage.  Patient sent by PCP.  On Friday he had transient vision loss in his right eye.  He went to go see his eye doctor today that told him this did not sound like an ophthalmology problem and called his PCP.  PCP sent him to the emergency department.  Ambulatory  Review of Systems  Postive:  Negative:   Physical Exam  BP (!) 171/49 (BP Location: Right Arm)   Pulse (!) 59   Temp 97.7 F (36.5 C) (Oral)   Resp 20   Ht  (1.626 m)   Wt 62.6 kg   SpO2 96%   BMI 23.69 kg/m  Gen:   Awake, no distress   Resp:  Normal effort  MSK:   Moves extremities without difficulty  Other:  Well-appearing, ambulatory without any abnormalities on quick eyeball exam.  No facial droop or aphasia  Medical Decision Making  Medically screening exam initiated at 4:16 PM.  Appropriate orders placed.  Naoma Diener was informed that the remainder of the evaluation will be completed by another provider, this initial triage assessment does not replace that evaluation, and the importance of remaining in the ED until their evaluation is complete.     Saddie Benders, [place] 11/22/22 [phone]

## 2022-11-22 NOTE — ED Notes (Signed)
Pt up to BR

## 2022-11-22 NOTE — Progress Notes (Signed)
Plan of Care Note for accepted transfer   Patient: Steve Mcgrath MRN: [id number]   DOA: 11/22/2022  Facility requesting transfer: [facility] ED Requesting Provider: Dr. Rush Landmark, EDP Reason for transfer: TIA work up  Facility course: The patient is a 89 year old male with past medical history significant for coronary artery disease status post CABG, prior MI, essential hypertension, type 2 diabetes, diabetic polyneuropathy, hyperlipidemia, hypothyroidism, CKD 3B, who presents to [facility] ED referred to by his ophthalmologist due to recent right eye vision loss.  States on Friday his right eye vision went black.  It has now resolved.  Not associated with any pain.    Went to his eye doctor today and was told that the issue was not related to his eye.  He was advised to go to the ED to rule out a TIA versus a stroke.  In the ED, vital signs are stable.  CT angio head and neck revealed the following findings: 1. No acute intracranial process. 2. 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 3. Moderate stenosis at the origin of the right vertebral artery. No other hemodynamically significant stenosis in the neck. 4. No intracranial large vessel occlusion or significant stenosis. 5. Aortic atherosclerosis. 6. Emphysema. Aortic Atherosclerosis (ICD10-I70.0) and Emphysema (ICD10-J43.9).  The patient takes a baby aspirin at home.  EDP discussed the case with teleneurology Dr. Selina Cooley who recommended adding Plavix, hospital admission for TIA workup, ESR CRP to rule out temporal arteritis.  The patient was admitted to [facility] telemetry medical unit as observation status.  Please contact neurology once the patient arrives to the hospital.  Plan of care: The patient is accepted for admission to Telemetry unit, at [facility].  Author: Darlin Drop, DO 11/22/2022  Check [email or web address] for on-call coverage.  Nursing staff, Please call [facility] Admits &  Consults System-Wide number on Amion as soon as patient's arrival, so appropriate admitting provider can evaluate the pt.

## 2022-11-22 NOTE — ED Notes (Signed)
Updated pt and hs wife re: bed status and the possibility of pt having to wait til am for a bed.

## 2022-11-22 NOTE — ED Triage Notes (Signed)
Pt sent by PCP to rule out TIA  States on Friday his right eye vision went black, states vision resolved, Denies pain Went to eye dr today and was told it was not an eye issue  Was told to come to ER    BEFAST negative, no other stroke symptoms noted

## 2022-11-23 ENCOUNTER — Observation Stay (HOSPITAL_COMMUNITY): Payer: Medicare Other

## 2022-11-23 DIAGNOSIS — E039 Hypothyroidism, unspecified: Secondary | ICD-10-CM | POA: Diagnosis not present

## 2022-11-23 DIAGNOSIS — R578 Other shock: Secondary | ICD-10-CM | POA: Diagnosis not present

## 2022-11-23 DIAGNOSIS — D62 Acute posthemorrhagic anemia: Secondary | ICD-10-CM | POA: Diagnosis not present

## 2022-11-23 DIAGNOSIS — E878 Other disorders of electrolyte and fluid balance, not elsewhere classified: Secondary | ICD-10-CM | POA: Diagnosis not present

## 2022-11-23 DIAGNOSIS — G453 Amaurosis fugax: Secondary | ICD-10-CM | POA: Diagnosis not present

## 2022-11-23 DIAGNOSIS — I252 Old myocardial infarction: Secondary | ICD-10-CM | POA: Diagnosis not present

## 2022-11-23 DIAGNOSIS — K219 Gastro-esophageal reflux disease without esophagitis: Secondary | ICD-10-CM | POA: Diagnosis not present

## 2022-11-23 DIAGNOSIS — E1142 Type 2 diabetes mellitus with diabetic polyneuropathy: Secondary | ICD-10-CM | POA: Diagnosis not present

## 2022-11-23 DIAGNOSIS — I2510 Atherosclerotic heart disease of native coronary artery without angina pectoris: Secondary | ICD-10-CM | POA: Diagnosis not present

## 2022-11-23 DIAGNOSIS — E785 Hyperlipidemia, unspecified: Secondary | ICD-10-CM | POA: Diagnosis not present

## 2022-11-23 DIAGNOSIS — N1831 Chronic kidney disease, stage 3a: Secondary | ICD-10-CM | POA: Diagnosis not present

## 2022-11-23 DIAGNOSIS — I350 Nonrheumatic aortic (valve) stenosis: Secondary | ICD-10-CM | POA: Diagnosis not present

## 2022-11-23 DIAGNOSIS — J439 Emphysema, unspecified: Secondary | ICD-10-CM | POA: Diagnosis not present

## 2022-11-23 DIAGNOSIS — N400 Enlarged prostate without lower urinary tract symptoms: Secondary | ICD-10-CM | POA: Diagnosis not present

## 2022-11-23 DIAGNOSIS — E1122 Type 2 diabetes mellitus with diabetic chronic kidney disease: Secondary | ICD-10-CM | POA: Diagnosis not present

## 2022-11-23 DIAGNOSIS — I129 Hypertensive chronic kidney disease with stage 1 through stage 4 chronic kidney disease, or unspecified chronic kidney disease: Secondary | ICD-10-CM | POA: Diagnosis not present

## 2022-11-23 DIAGNOSIS — R011 Cardiac murmur, unspecified: Secondary | ICD-10-CM | POA: Diagnosis not present

## 2022-11-23 DIAGNOSIS — I700 Atherosclerosis of aorta: Secondary | ICD-10-CM | POA: Diagnosis not present

## 2022-11-23 DIAGNOSIS — E1151 Type 2 diabetes mellitus with diabetic peripheral angiopathy without gangrene: Secondary | ICD-10-CM | POA: Diagnosis not present

## 2022-11-23 DIAGNOSIS — G459 Transient cerebral ischemic attack, unspecified: Secondary | ICD-10-CM | POA: Diagnosis present

## 2022-11-23 DIAGNOSIS — Y838 Other surgical procedures as the cause of abnormal reaction of the patient, or of later complication, without mention of misadventure at the time of the procedure: Secondary | ICD-10-CM | POA: Diagnosis not present

## 2022-11-23 DIAGNOSIS — I97618 Postprocedural hemorrhage and hematoma of a circulatory system organ or structure following other circulatory system procedure: Secondary | ICD-10-CM | POA: Diagnosis not present

## 2022-11-23 DIAGNOSIS — E872 Acidosis, unspecified: Secondary | ICD-10-CM | POA: Diagnosis not present

## 2022-11-23 DIAGNOSIS — H3530 Unspecified macular degeneration: Secondary | ICD-10-CM | POA: Diagnosis not present

## 2022-11-23 LAB — C-REACTIVE PROTEIN: CRP: 0.6 mg/dL (ref <1.0)

## 2022-11-23 MED ORDER — ACETAMINOPHEN 325 MG PO TABS: 650.0000 mg | ORAL_TABLET | ORAL | Status: DC | PRN

## 2022-11-23 MED ORDER — ACETAMINOPHEN 160 MG/5ML PO SOLN: 650.0000 mg | ORAL | Status: DC | PRN

## 2022-11-23 MED ORDER — ASPIRIN 81 MG PO TBEC
81.0000 mg | DELAYED_RELEASE_TABLET | Freq: Every day | ORAL | Status: DC
  Administered 2022-11-23 – 2022-11-26 (×3): 81 mg via ORAL
  Filled 2022-11-23 (×3): qty 1

## 2022-11-23 MED ORDER — ASPIRIN 81 MG PO TBEC: 81.0000 mg | DELAYED_RELEASE_TABLET | Freq: Every day | ORAL | Status: DC

## 2022-11-23 MED ORDER — SENNOSIDES-DOCUSATE SODIUM 8.6-50 MG PO TABS: 1.0000 | ORAL_TABLET | Freq: Every evening | ORAL | Status: DC | PRN

## 2022-11-23 MED ORDER — HYDROCHLOROTHIAZIDE 12.5 MG PO TABS: 12.5000 mg | ORAL_TABLET | Freq: Every day | ORAL | Status: DC

## 2022-11-23 MED ORDER — PANTOPRAZOLE SODIUM 40 MG PO TBEC
40.0000 mg | DELAYED_RELEASE_TABLET | Freq: Every day | ORAL | Status: DC
  Administered 2022-11-24 – 2022-11-29 (×5): 40 mg via ORAL
  Filled 2022-11-23 (×6): qty 1

## 2022-11-23 MED ORDER — HEPARIN SODIUM (PORCINE) 5000 UNIT/ML IJ SOLN
5000.0000 [IU] | INTRAMUSCULAR | Status: DC
  Administered 2022-11-23 – 2022-11-24 (×3): 5000 [IU] via SUBCUTANEOUS
  Filled 2022-11-23 (×3): qty 1

## 2022-11-23 MED ORDER — LEVOTHYROXINE SODIUM 100 MCG PO TABS
100.0000 ug | ORAL_TABLET | Freq: Every day | ORAL | Status: DC
  Administered 2022-11-24 – 2022-11-29 (×3): 100 ug via ORAL
  Filled 2022-11-23 (×3): qty 1

## 2022-11-23 MED ORDER — ACETAMINOPHEN 650 MG RE SUPP: 650.0000 mg | RECTAL | Status: DC | PRN

## 2022-11-23 MED ORDER — CLOPIDOGREL BISULFATE 75 MG PO TABS
75.0000 mg | ORAL_TABLET | Freq: Every day | ORAL | Status: DC
  Administered 2022-11-23 – 2022-11-24 (×2): 75 mg via ORAL
  Filled 2022-11-23 (×2): qty 1

## 2022-11-23 MED ORDER — AMLODIPINE BESYLATE 10 MG PO TABS
10.0000 mg | ORAL_TABLET | Freq: Every day | ORAL | Status: DC
  Administered 2022-11-24 – 2022-11-29 (×5): 10 mg via ORAL
  Filled 2022-11-23 (×6): qty 1

## 2022-11-23 MED ORDER — STROKE: EARLY STAGES OF RECOVERY BOOK
Freq: Once | Status: AC
  Filled 2022-11-23: qty 1

## 2022-11-23 MED ORDER — ATORVASTATIN CALCIUM 40 MG PO TABS
40.0000 mg | ORAL_TABLET | Freq: Every day | ORAL | Status: DC
  Administered 2022-11-23: 40 mg via ORAL
  Filled 2022-11-23: qty 1

## 2022-11-23 MED ORDER — HYDROCHLOROTHIAZIDE 12.5 MG PO TABS
12.5000 mg | ORAL_TABLET | Freq: Every day | ORAL | Status: DC
  Administered 2022-11-24: 12.5 mg via ORAL
  Filled 2022-11-23 (×2): qty 1

## 2022-11-23 NOTE — H&P (Addendum)
History and Physical    JEOVANY HUITRON ZOX:[id number] DOB: 1927-01-18 DOA: 11/22/2022  PCP: Andreas Blower., MD (Confirm with patient/family/NH records and if not entered, this has to be entered at [facility] point of entry) Patient coming from: Home  I have personally briefly reviewed patient's old medical records in [facility]  Chief Complaint: Feeling good  HPI: Steve Mcgrath is a 89 y.o. male with medical history significant of CAD with CABG, IIDM, CKD stage II, hypothyroidism, presented with transient vision loss.  Symptoms happened on last Friday, patient woke up with right sided partial vision loss he described as a whole vision was blurry with the dark spot in the center lasted about few hours.  Later that day the symptoms came back but resolved by itself, denied any numbness weakness of any of the arms or legs, no headaches no hearing changes.  Symptoms started on Sunday and patient went to see ophthalmologist on Monday and was told there was no issue with the right eye. PCP informed and told patient to come to ED. takes aspirin 81 mg daily.  ED Course: Vital signs stable, afebrile, nontachycardic blood pressure slightly elevated nonhypoxic.  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  Neurology consulted and recommended patient come to [facility] for MRI.  Review of Systems: As per HPI otherwise 14 point review of systems negative.    Past Medical History:  Diagnosis Date   CAD (coronary artery disease)    DM (diabetes mellitus)    Enlarged prostate    Hypothyroid    Kidney stones    MI, acute, non ST segment elevation    Murmur     Past Surgical History:  Procedure Laterality Date   CORONARY ARTERY BYPASS GRAFT     TONSILLECTOMY       reports that he has quit smoking. His smoking use included cigarettes. He does not have any smokeless tobacco  history on file. He reports that he does not drink alcohol. No history on file for drug use.  No Known Allergies  History reviewed. No pertinent family history.   Prior to Admission medications   Medication Sig Start Date End Date Taking? Authorizing Provider  alendronate (FOSAMAX) 70 MG tablet Take by mouth. 09/10/21  Yes [provider]  amLODipine (NORVASC) 10 MG tablet Take 1 tablet by mouth daily. 09/27/22 11/04/23 Yes [provider]  aspirin EC 81 MG tablet Take 1 tablet by mouth daily. 08/17/16  Yes [provider]  atorvastatin (LIPITOR) 40 MG tablet Take 1 tablet by mouth at bedtime. 08/29/18  Yes [provider]  calcium carbonate (SUPER CALCIUM) 1500 (600 Ca) MG TABS tablet Take by mouth daily. 01/13/22  Yes [provider]  cholecalciferol (VITAMIN D3) 25 MCG (1000 UNIT) tablet Take 1,000 Units by mouth daily. 10/13/18  Yes [provider]  cyanocobalamin (VITAMIN B12) 1000 MCG tablet Take 1,000 mcg by mouth daily. 07/31/21  Yes [provider]  hydrochlorothiazide (MICROZIDE) 12.5 MG capsule Take 12.5 mg by mouth daily. 09/17/14  Yes [provider]  levothyroxine (SYNTHROID) 100 MCG tablet Take 100 mcg by mouth daily before breakfast. 08/05/22  Yes [provider]  omeprazole (PRILOSEC) 40 MG capsule Take 1 capsule by mouth daily. 12/15/21  Yes [provider]  COVID-19 At Home Antigen Test [facility] COVID-19 HOME TEST) KIT Use as directed Patient not taking:  Reported on 11/23/2022 08/03/21   Gwenlyn Fudge, RPH  COVID-19 At Home Antigen Test [facility] COVID-19 HOME TEST) KIT Use as directed Patient not taking: Reported on 11/23/2022 08/03/21   Gwenlyn Fudge, [facility]  molnupiravir EUA (LAGEVRIO) 200 MG CAPS capsule Take 4 capsules by mouth twice daily for 5 days Patient not taking: Reported on 11/23/2022 08/03/21   Cathren Laine, MD    Physical Exam: Vitals:   11/23/22 1009 11/23/22 1010 11/23/22 1148  11/23/22 1252  BP: (!) 170/84  (!) 158/74 (!) 167/69  Pulse: 62  64 69  Resp: Temp:  97.8 F (36.6 C) 97.8 F (36.6 C) (!) 97.5 F (36.4 C)  TempSrc:    Oral  SpO2: 99%  98% 94%  Weight:      Height:        Constitutional: NAD, calm, comfortable Vitals:   11/23/22 1009 11/23/22 1010 11/23/22 1148 11/23/22 1252  BP: (!) 170/84  (!) 158/74 (!) 167/69  Pulse: 62  64 69  Resp: Temp:  97.8 F (36.6 C) 97.8 F (36.6 C) (!) 97.5 F (36.4 C)  TempSrc:    Oral  SpO2: 99%  98% 94%  Weight:      Height:       Eyes: PERRL, lids and conjunctivae normal ENMT: Mucous membranes are moist. Posterior pharynx clear of any exudate or lesions.Normal dentition.  Neck: normal, supple, no masses, no thyromegaly Respiratory: clear to auscultation bilaterally, no wheezing, no crackles. Normal respiratory effort. No accessory muscle use.  Cardiovascular: Regular rate and rhythm, loud systolic murmur on apex. No extremity edema. 2+ pedal pulses. No carotid bruits.  Abdomen: no tenderness, no masses palpated. No hepatosplenomegaly. Bowel sounds positive.  Musculoskeletal: no clubbing / cyanosis. No joint deformity upper and lower extremities. Good ROM, no contractures. Normal muscle tone.  Skin: no rashes, lesions, ulcers. No induration Neurologic: CN 2-12 grossly intact. Sensation intact, DTR normal. Strength 5/5 in all 4.  Psychiatric: Normal judgment and insight. Alert and oriented x 3. Normal mood.     Labs on Admission: I have personally reviewed following labs and imaging studies  CBC: Recent Labs  Lab 11/22/22 1601  WBC 6.6  NEUTROABS 3.9  HGB 11.8*  HCT 35.2*  MCV 90.0  PLT 175   Basic Metabolic Panel: Recent Labs  Lab 11/22/22 1601  NA 137  K 4.2  CL 106  CO2 23  GLUCOSE 129*  BUN 38*  CREATININE 1.36*  CALCIUM 8.9   GFR: Estimated Creatinine Clearance: 28.4 mL/min (A) (by C-G formula based on SCr of 1.36 mg/dL (H)). Liver Function  Tests: Recent Labs  Lab 11/22/22 1601  AST 18  ALT 14  ALKPHOS 64  BILITOT 0.4  PROT 7.0  ALBUMIN 3.9   No results for input(s): "LIPASE", "AMYLASE" in the last 168 hours. No results for input(s): "AMMONIA" in the last 168 hours. Coagulation Profile: Recent Labs  Lab 11/22/22 1617  INR 1.1   Cardiac Enzymes: No results for input(s): "CKTOTAL", "CKMB", "CKMBINDEX", "TROPONINI" in the last 168 hours. BNP (last 3 results) No results for input(s): "PROBNP" in the last 8760 hours. HbA1C: No results for input(s): "HGBA1C" in the last 72 hours. CBG: No results for input(s): "GLUCAP" in the last 168 hours. Lipid Profile: No results for input(s): "CHOL", "HDL", "LDLCALC", "TRIG", "CHOLHDL", "LDLDIRECT" in the last 72 hours. Thyroid Function Tests: No results for input(s): "TSH", "T4TOTAL", "FREET4", "T3FREE", "THYROIDAB" in the last  72 hours. Anemia Panel: No results for input(s): "VITAMINB12", "FOLATE", "FERRITIN", "TIBC", "IRON", "RETICCTPCT" in the last 72 hours. Urine analysis: No results found for: "COLORURINE", "APPEARANCEUR", "LABSPEC", "PHURINE", "GLUCOSEU", "HGBUR", "BILIRUBINUR", "KETONESUR", "PROTEINUR", "UROBILINOGEN", "NITRITE", "LEUKOCYTESUR"  Radiological Exams on Admission: CT ANGIO HEAD NECK W WO CM  Result Date: 11/22/2022 CLINICAL DATA:  Transient vision loss in right eye on Friday EXAM: CT ANGIOGRAPHY HEAD AND NECK WITH AND WITHOUT CONTRAST TECHNIQUE: Multidetector CT imaging of the head and neck was performed using the standard protocol during bolus administration of intravenous contrast. Multiplanar CT image reconstructions and MIPs were obtained to evaluate the vascular anatomy. Carotid stenosis measurements (when applicable) are obtained utilizing NASCET criteria, using the distal internal carotid diameter as the denominator. RADIATION DOSE REDUCTION: This exam was performed according to the departmental dose-optimization program which includes automated exposure  control, adjustment of the mA and/or kV according to patient size and/or use of iterative reconstruction technique. CONTRAST:  75mL OMNIPAQUE IOHEXOL 350 MG/ML SOLN COMPARISON:  No prior CTA available, correlation is made with 04/14/2021 CT head FINDINGS: CT HEAD FINDINGS Brain: No evidence of acute infarct, hemorrhage, mass, mass effect, or midline shift. No hydrocephalus or extra-axial fluid collection. Periventricular white matter changes, likely the sequela of chronic small vessel ischemic disease. Remote lacunar infarcts in the bilateral basal ganglia. Normal cerebral volume for age. Vascular: No hyperdense vessel. Skull: Negative for fracture or focal lesion. Sinuses/Orbits: Mucosal thickening in the ethmoid air cells. Status post bilateral lens replacements. Other: The mastoid air cells are well aerated. CTA NECK FINDINGS Aortic arch: Standard branching. Imaged portion shows no evidence of aneurysm or dissection. Aortic atherosclerosis. 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 Atherosclerotic plaque at the origin of the left common carotid and left subclavian arteries is not hemodynamically significant. Right carotid system: No evidence of dissection, occlusion, or hemodynamically significant stenosis (greater than 50%). Left carotid system: No evidence of dissection, occlusion, or hemodynamically significant stenosis (greater than 50%). Vertebral arteries: Moderate stenosis at the origin of the right vertebral artery. No other significant stenosis in the vertebral arteries. No evidence of dissection. Skeleton: No acute osseous abnormality. Degenerative changes in the cervical spine. Other neck: Negative. Upper chest: No focal pulmonary opacity or pleural effusion. Apical pleural-parenchymal scarring. Paraseptal emphysema. Review of the MIP images confirms the above findings CTA HEAD FINDINGS Anterior circulation: Both  internal carotid arteries are patent to the termini, with mild stenosis in the bilateral supraclinoid segments. A1 segments patent, somewhat hypoplastic on the right. Normal anterior communicating artery. Anterior cerebral arteries are patent to their distal aspects without significant stenosis. No M1 stenosis or occlusion. MCA branches perfused to their distal aspects without significant stenosis. Posterior circulation: Vertebral arteries patent to the vertebrobasilar junction without significant stenosis. Posterior inferior cerebellar arteries patent proximally. Basilar patent to its distal aspect without significant stenosis. Superior cerebellar arteries patent proximally. Patent right P1. Fetal origin of the left PCA from the left posterior communicating artery. PCAs perfused to their distal aspects without significant stenosis. Venous sinuses: As permitted by contrast timing, patent. Anatomic variants: Fetal origin of the left PCA. Review of the MIP images confirms the above findings IMPRESSION: 1. No acute intracranial process. 2. 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 3. Moderate stenosis at the origin of the right vertebral artery. No other hemodynamically significant stenosis in the neck. 4. No intracranial  large vessel occlusion or significant stenosis. 5. Aortic atherosclerosis. 6. Emphysema. Aortic Atherosclerosis (ICD10-I70.0) and Emphysema (ICD10-J43.9). Electronically Signed   By: Wiliam Ke M.D.   On: 11/22/2022 19:12    EKG: Independently reviewed.  Sinus rhythm, no acute ST changes.  Assessment/Plan Principal Problem:   TIA (transient ischemic attack)  (please populate well all problems here in Problem List. (For example, if patient is on BP meds at home and you resume or decide to hold them, it is a problem that needs to be her. Same for CAD, COPD, HLD and so on)  Amaurosis fugax, right -TIA likely -CTA negative for major stenosis  intracranially -D/W neuro at bedside, who recommends dual antiplatelet aspirin Plavix for 3 weeks -Out of window for permissive hypertension, resume home BP meds -Telemonitoring x 24 hours, may need to continue outpatient cardiac monitoring -Check A1c and lipid panel -PT evaluation  HTN -Continue hydrochlorothiazide and amlodipine  CKD stage II -May need to consider change HCTZ to other BP meds, defer to PCP  Hypothyroidism -Continue Synthroid  GERD -Continue PPI   DVT prophylaxis: Heparin subcu Code Status: Full code Family Communication: Daughter at bedside Disposition Plan: Expect less than 2 midnight hospital stay Consults called: Neurology Admission status: Telemetry observation   Emeline General MD [facility] Pager [phone]  11/23/2022, 1:51 PM

## 2022-11-23 NOTE — Progress Notes (Signed)
SLP Cancellation Note  Patient Details Name: Steve Mcgrath MRN: [id number] DOB: 05/21/1927   Cancelled treatment:       Reason Eval/Treat Not Completed: Patient at procedure or test/unavailable- in MRI. Will continue efforts.    [name] 11/23/2022, 2:11 PM

## 2022-11-23 NOTE — Consult Note (Signed)
Neurology Consultation  Reason for Consult: Transient vision loss in right eye Referring Physician: Dr. Chipper Herb  CC: Transient loss of vision in right eye  History is obtained from: Patient and chart  HPI: Steve Mcgrath is a 89 y.o. male with history of CAD status post CABG, diabetes, BPH, hypothyroidism, MI, CKD 3, diabetic polyneuropathy, macular degeneration, hypertension and hyperlipidemia who presents after having transient loss of vision in his right eye on Friday morning.  Patient states that on Friday morning, he was doing some yard work and noticed that the vision in his right eye went black except for a small circle of light in the middle of his field of vision.  This lasted for about half an hour and then resolved spontaneously.  He had no pain or other symptoms associated with this.  Patient was seen by his ophthalmologist who ruled out any ocular causes for this transient vision loss and advised him to come to the emergency department.  GCA/PMR symptoms denied by patient  LKW: 4/19 morning TNK given?: no, symptoms resolved IR Thrombectomy? No, no LVO and symptoms resolved Modified Rankin Scale: 1-No significant post stroke disability and can perform usual duties with stroke symptoms  ROS: A complete ROS was performed and is negative except as noted in the HPI.   Past Medical History:  Diagnosis Date   CAD (coronary artery disease)    DM (diabetes mellitus)    Enlarged prostate    Hypothyroid    Kidney stones    MI, acute, non ST segment elevation    Murmur      History reviewed. No pertinent family history.   Social History:   reports that he has quit smoking. His smoking use included cigarettes. He does not have any smokeless tobacco history on file. He reports that he does not drink alcohol. No history on file for drug use.  Medications  Current Facility-Administered Medications:    [START ON 11/24/2022]  stroke: early stages of recovery book, , Does not apply,  Once, de La Torre, Tyrone E, NP   aspirin EC tablet 81 mg, 81 mg, Oral, Daily, de Saintclair Halsted, Cortney E, NP   clopidogrel (PLAVIX) tablet 75 mg, 75 mg, Oral, Daily, de Saintclair Halsted, Cortney E, NP   Exam: Current vital signs: BP (!) 167/69 (BP Location: Left Arm)   Pulse 69   Temp (!) 97.5 F (36.4 C) (Oral)   Resp 15   Ht 5\' 4"  (1.626 m)   Wt 62.6 kg   SpO2 94%   BMI 23.69 kg/m  Vital signs in last 24 hours: Temp:  [97.3 F (36.3 C)-97.8 F (36.6 C)] 97.5 F (36.4 C) (04/23 1252) Pulse Rate:  [59-69] 69 (04/23 1252) Resp:  [15-20] 15 (04/23 1252) BP: (130-171)/(45-84) 167/69 (04/23 1252) SpO2:  [93 %-100 %] 94 % (04/23 1252) Weight:  [62.6 kg] 62.6 kg (04/22 1555)  GENERAL: Awake, alert, in no acute distress Psych: Affect appropriate for situation, patient is calm and cooperative with examination Head: Normocephalic and atraumatic, without obvious abnormality EENT: Normal conjunctivae, dry mucous membranes, no OP obstruction LUNGS: Normal respiratory effort. Non-labored breathing on room air Extremities: warm, well perfused, without obvious deformity  NEURO:  Mental Status: Awake, alert, and oriented to person, place, time, and situation. He is able to provide a clear and coherent history of present illness. Speech/Language: speech is clear and fluent.   Naming, fluency, and comprehension intact without aphasia  No neglect is noted Cranial Nerves:  II: PERRLvisual  fields full.  III, IV, VI: EOMI. Lid elevation symmetric and full.  V: Sensation is intact to light touch and symmetrical to face. VII: Subtle right facial droop noted, but this is baseline per patient's family VIII: Hearing intact to voice IX, X: Phonation normal.  XI: Normal sternocleidomastoid and trapezius muscle strength XII: Tongue protrudes midline without fasciculations.   Motor: 5/5 strength is all muscle groups.  Tone is normal. Bulk is normal.  Sensation: Intact to light touch bilaterally in all  four extremities. No extinction to DSS present.  Coordination: FTN intact bilaterally.  No pronator drift.  Gait: Deferred  NIHSS: 1a Level of Conscious.: 0 1b LOC Questions: 0 1c LOC Commands: 0 2 Best Gaze: 0 3 Visual: 0 4 Facial Palsy: 1 5a Motor Arm - left: 0 5b Motor Arm - Right: 0 6a Motor Leg - Left: 0 6b Motor Leg - Right: 0 7 Limb Ataxia: 0 8 Sensory: 0 9 Best Language: 0 10 Dysarthria: 0 11 Extinct. and Inatten.: 0 TOTAL: 1   Labs I have reviewed labs in epic and the results pertinent to this consultation are:   CBC    Component Value Date/Time   WBC 6.6 11/22/2022 1601   RBC 3.91 (L) 11/22/2022 1601   HGB 11.8 (L) 11/22/2022 1601   HCT 35.2 (L) 11/22/2022 1601   PLT 175 11/22/2022 1601   MCV 90.0 11/22/2022 1601   MCH 30.2 11/22/2022 1601   MCHC 33.5 11/22/2022 1601   RDW 13.8 11/22/2022 1601   LYMPHSABS 2.0 11/22/2022 1601   MONOABS 0.6 11/22/2022 1601   EOSABS 0.1 11/22/2022 1601   BASOSABS 0.0 11/22/2022 1601    CMP     Component Value Date/Time   NA 137 11/22/2022 1601   K 4.2 11/22/2022 1601   CL 106 11/22/2022 1601   CO2 23 11/22/2022 1601   GLUCOSE 129 (H) 11/22/2022 1601   BUN 38 (H) 11/22/2022 1601   CREATININE 1.36 (H) 11/22/2022 1601   CALCIUM 8.9 11/22/2022 1601   PROT 7.0 11/22/2022 1601   ALBUMIN 3.9 11/22/2022 1601   AST 18 11/22/2022 1601   ALT 14 11/22/2022 1601   ALKPHOS 64 11/22/2022 1601   BILITOT 0.4 11/22/2022 1601   GFRNONAA 49 (L) 11/22/2022 1601    Lipid Panel  No results found for: "CHOL", "TRIG", "HDL", "CHOLHDL", "VLDL", "LDLCALC", "LDLDIRECT"   Imaging I have reviewed the images obtained:  CT-scan of the brain: No acute abnormality  CTA head and neck: 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  Patient reports that he was outside doing yard work when the vision in his right eye went black with a small circle of light in the middle and this lasted for about 30 minutes.  The visual disturbance resolved spontaneously.  He states that this has not happened in the past but that he has baseline visual deficit due to macular degeneration.  Patient was seen by his ophthalmologist who ruled out ocular causes for this for transient vision loss and directed him to come to the ED.  Patient does have multiple risk factors for stroke, will obtain MRI to see if this was  a stroke or TIA and will obtain full stroke workup.  Deficits are atypical for stroke or TIA presentation but may represent amaurosis fugax.  Will start aspirin and Plavix for dual antiplatelet therapy.  Impression: Transient vision loss in patient with multiple stroke risk factors, likely small stroke or amaurosis fugax  Recommendations: Stroke/TIA Workup - Admit for stroke workup - Permissive HTN x48 hrs from sx onset or until stroke ruled out by MRI goal BP <220/110. PRN labetalol or hydralazine if BP above these parameters. Avoid oral antihypertensives.  - MRI resulted negative, okay to gradually normalize BP going forward  - MRI brain wo contrast (resulted negative) - TTE pending - Check A1c and LDL + add statin per guidelines - Aspirin 81 mg daily and Plavix 75 mg daily antiplt for 21 day course - q4 hr neuro checks - STAT head CT for any change in neuro exam - Telemetry  - PT/OT/SLP not needed given patient is at his baseline - Stroke education - Amb referral to neurology upon discharge    Pt seen by NP/Neuro and later by MD. Note/plan to be edited by MD as needed.  Cortney E Ernestina Columbia , MSN, AGACNP-BC [facility] See Amion for schedule and pager information 11/23/2022 1:16 PM   Attending Neurologist's note:  I personally saw this patient, gathering history, performing a neurologic examination, reviewing relevant labs, personally reviewing relevant imaging including head CT, CTA head and neck, MRI brain, and formulated the assessment and plan, adding the note above for completeness and clarity to accurately reflect my thoughts

## 2022-11-23 NOTE — ED Notes (Signed)
Called [facility] for Transport talked to [place] at 9:57

## 2022-11-23 NOTE — ED Notes (Signed)
Report provided to [facility] for transport to [facility] 

## 2022-11-23 NOTE — Progress Notes (Signed)
PT Cancellation Note  Patient Details Name: Steve Mcgrath MRN: [id number] DOB: 07/18/1927   Cancelled Treatment:    Reason Eval/Treat Not Completed: Patient at procedure or test/unavailable.Pt transporting off unit to MRI. PT will follow up for evaluation as appropriate/available. Thank you.    Leonie Man 11/23/2022, 2:02 PM

## 2022-11-24 ENCOUNTER — Observation Stay (HOSPITAL_COMMUNITY): Payer: Medicare Other

## 2022-11-24 DIAGNOSIS — I252 Old myocardial infarction: Secondary | ICD-10-CM | POA: Diagnosis not present

## 2022-11-24 DIAGNOSIS — I129 Hypertensive chronic kidney disease with stage 1 through stage 4 chronic kidney disease, or unspecified chronic kidney disease: Secondary | ICD-10-CM | POA: Diagnosis present

## 2022-11-24 DIAGNOSIS — J439 Emphysema, unspecified: Secondary | ICD-10-CM | POA: Diagnosis present

## 2022-11-24 DIAGNOSIS — G459 Transient cerebral ischemic attack, unspecified: Secondary | ICD-10-CM

## 2022-11-24 DIAGNOSIS — G453 Amaurosis fugax: Secondary | ICD-10-CM | POA: Diagnosis present

## 2022-11-24 DIAGNOSIS — I771 Stricture of artery: Secondary | ICD-10-CM | POA: Diagnosis not present

## 2022-11-24 DIAGNOSIS — I350 Nonrheumatic aortic (valve) stenosis: Secondary | ICD-10-CM | POA: Diagnosis present

## 2022-11-24 DIAGNOSIS — N400 Enlarged prostate without lower urinary tract symptoms: Secondary | ICD-10-CM | POA: Diagnosis present

## 2022-11-24 DIAGNOSIS — I97618 Postprocedural hemorrhage and hematoma of a circulatory system organ or structure following other circulatory system procedure: Secondary | ICD-10-CM | POA: Diagnosis not present

## 2022-11-24 DIAGNOSIS — E039 Hypothyroidism, unspecified: Secondary | ICD-10-CM | POA: Diagnosis present

## 2022-11-24 DIAGNOSIS — R578 Other shock: Secondary | ICD-10-CM | POA: Diagnosis not present

## 2022-11-24 DIAGNOSIS — E1142 Type 2 diabetes mellitus with diabetic polyneuropathy: Secondary | ICD-10-CM | POA: Diagnosis present

## 2022-11-24 DIAGNOSIS — E785 Hyperlipidemia, unspecified: Secondary | ICD-10-CM | POA: Diagnosis present

## 2022-11-24 DIAGNOSIS — D62 Acute posthemorrhagic anemia: Secondary | ICD-10-CM | POA: Diagnosis not present

## 2022-11-24 DIAGNOSIS — N1831 Chronic kidney disease, stage 3a: Secondary | ICD-10-CM | POA: Diagnosis not present

## 2022-11-24 DIAGNOSIS — I6521 Occlusion and stenosis of right carotid artery: Secondary | ICD-10-CM | POA: Diagnosis not present

## 2022-11-24 DIAGNOSIS — I10 Essential (primary) hypertension: Secondary | ICD-10-CM | POA: Diagnosis not present

## 2022-11-24 DIAGNOSIS — E878 Other disorders of electrolyte and fluid balance, not elsewhere classified: Secondary | ICD-10-CM | POA: Diagnosis present

## 2022-11-24 DIAGNOSIS — E1151 Type 2 diabetes mellitus with diabetic peripheral angiopathy without gangrene: Secondary | ICD-10-CM | POA: Diagnosis present

## 2022-11-24 DIAGNOSIS — I700 Atherosclerosis of aorta: Secondary | ICD-10-CM | POA: Diagnosis present

## 2022-11-24 DIAGNOSIS — Y838 Other surgical procedures as the cause of abnormal reaction of the patient, or of later complication, without mention of misadventure at the time of the procedure: Secondary | ICD-10-CM | POA: Diagnosis not present

## 2022-11-24 DIAGNOSIS — R011 Cardiac murmur, unspecified: Secondary | ICD-10-CM | POA: Diagnosis present

## 2022-11-24 DIAGNOSIS — K219 Gastro-esophageal reflux disease without esophagitis: Secondary | ICD-10-CM | POA: Diagnosis present

## 2022-11-24 DIAGNOSIS — H3530 Unspecified macular degeneration: Secondary | ICD-10-CM | POA: Diagnosis present

## 2022-11-24 DIAGNOSIS — E1122 Type 2 diabetes mellitus with diabetic chronic kidney disease: Secondary | ICD-10-CM | POA: Diagnosis present

## 2022-11-24 DIAGNOSIS — I2510 Atherosclerotic heart disease of native coronary artery without angina pectoris: Secondary | ICD-10-CM | POA: Diagnosis present

## 2022-11-24 DIAGNOSIS — E872 Acidosis, unspecified: Secondary | ICD-10-CM | POA: Diagnosis not present

## 2022-11-24 DIAGNOSIS — H3411 Central retinal artery occlusion, right eye: Secondary | ICD-10-CM | POA: Diagnosis not present

## 2022-11-24 LAB — ECHOCARDIOGRAM COMPLETE
AR max vel: 0.67 cm2
AV Area VTI: 0.81 cm2
AV Area mean vel: 0.71 cm2
AV Mean grad: 34 mmHg
AV Peak grad: 69.6 mmHg
AV Vena cont: 0.4 cm
Ao pk vel: 4.17 m/s
Area-P 1/2: 3.17 cm2
Calc EF: 67 %
Height: 64 in
MV VTI: 2.51 cm2
P 1/2 time: 546 msec
S' Lateral: 2.7 cm
Single Plane A2C EF: 70.4 %
Single Plane A4C EF: 62.4 %
Weight: 2208 oz

## 2022-11-24 LAB — BASIC METABOLIC PANEL
Anion gap: 11 (ref 5–15)
BUN: 27 mg/dL — ABNORMAL HIGH (ref 8–23)
CO2: 22 mmol/L (ref 22–32)
Calcium: 9.2 mg/dL (ref 8.9–10.3)
Chloride: 104 mmol/L (ref 98–111)
Creatinine, Ser: 1.42 mg/dL — ABNORMAL HIGH (ref 0.61–1.24)
GFR, Estimated: 46 mL/min — ABNORMAL LOW (ref >60)
Glucose, Bld: 99 mg/dL (ref 70–99)
Potassium: 4 mmol/L (ref 3.5–5.1)
Sodium: 137 mmol/L (ref 135–145)

## 2022-11-24 LAB — LIPID PANEL
Cholesterol: 166 mg/dL (ref 0–200)
HDL: 49 mg/dL (ref >40)
LDL Cholesterol: 102 mg/dL — ABNORMAL HIGH (ref 0–99)
Total CHOL/HDL Ratio: 3.4 RATIO
Triglycerides: 75 mg/dL (ref <150)
VLDL: 15 mg/dL (ref 0–40)

## 2022-11-24 MED ORDER — HEPARIN SODIUM (PORCINE) 5000 UNIT/ML IJ SOLN
5000.0000 [IU] | INTRAMUSCULAR | Status: DC
  Filled 2022-11-24: qty 1

## 2022-11-24 MED ORDER — ATORVASTATIN CALCIUM 80 MG PO TABS
80.0000 mg | ORAL_TABLET | Freq: Every day | ORAL | Status: DC
  Administered 2022-11-24 – 2022-11-28 (×4): 80 mg via ORAL
  Filled 2022-11-24 (×4): qty 1

## 2022-11-24 NOTE — Progress Notes (Signed)
   IR Unable to accommodate procedure today secondary schedule. Procedure rescheduled to 730 am 4/25  RN made aware--- MD made aware

## 2022-11-24 NOTE — Progress Notes (Addendum)
STROKE TEAM PROGRESS NOTE   INTERVAL HISTORY His wife is at the bedside. He is sitting up in bed in no apparent distress.  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  Patient to have cerebral arteriogram tomorrow for evaluation of cerebral artery stenosis  placed patient on aspirin 81 mg and Plavix 75 mg for 3 months will increase atorvastatin to 80 He denies any prior history of strokes or TIAs.  He states he has been compliant with his medications.  He is quite independent and is in fact even driving and does his own yard work. Vitals:   11/24/22 0101 11/24/22 0424 11/24/22 0756 11/24/22 1130  BP: (!) 129/44 (!) 135/52 (!) 143/50 (!) 128/45  Pulse:  61    Resp: 19 19    Temp: 98 F (36.7 C) 98.1 F (36.7 C) (!) 97.4 F (36.3 C)   TempSrc: Oral Oral Oral Oral  SpO2: 95%     Weight:      Height:       CBC:  Recent Labs  Lab 11/22/22 1601  WBC 6.6  NEUTROABS 3.9  HGB 11.8*  HCT 35.2*  MCV 90.0  PLT 175   Basic Metabolic Panel:  Recent Labs  Lab 11/22/22 1601 11/24/22 0321  NA 137 137  K 4.2 4.0  CL 106 104  CO2 23 22  GLUCOSE 129* 99  BUN 38* 27*  CREATININE 1.36* 1.42*  CALCIUM 8.9 9.2   Lipid Panel:  Recent Labs  Lab 11/24/22 0321  CHOL 166  TRIG 75  HDL 49  CHOLHDL 3.4  VLDL 15  LDLCALC 409*   HgbA1c: No results for input(s): "HGBA1C" in the last 168 hours. Urine Drug Screen: No results for input(s): "LABOPIA", "COCAINSCRNUR", "LABBENZ", "AMPHETMU", "THCU", "LABBARB" in the last 168 hours.  Alcohol Level  Recent Labs  Lab 11/22/22 1713  ETH <10    IMAGING past 24 hours ECHOCARDIOGRAM COMPLETE  Result Date: 11/24/2022    ECHOCARDIOGRAM REPORT   Patient Name:   Steve Mcgrath Date of Exam: 11/24/2022 Medical Rec #:  [id number]     Height:       64.0 in Accession #:    [id number]     Weight:       138.0 lb Date of Birth:  1927-01-16    BSA:          1.671 m Patient Age:    89 years      BP:           128/45 mmHg Patient Gender: M             HR:           60 bpm. Exam Location:  Inpatient Procedure: 2D Echo, Color Doppler and Cardiac Doppler REPORT CONTAINS CRITICAL RESULT Indications:    Stroke  History:        Patient has no prior history of Echocardiogram examinations. CAD                 and Previous Myocardial Infarction, Prior CABG, TIA,                 Signs/Symptoms:Murmur; Risk Factors:Diabetes.  Sonographer:    Wallie Char Referring Phys: Lennox Solders [place] IMPRESSIONS  1. Left ventricular ejection fraction, by estimation, is 60  to 65%. The left ventricle has normal function. The left ventricle has no regional wall motion abnormalities. There is mild left ventricular hypertrophy of the basal-septal segment. Left ventricular diastolic parameters are indeterminate. Elevated left ventricular end-diastolic pressure.  2. Right ventricular systolic function is normal. The right ventricular size is mildly enlarged. There is mildly elevated pulmonary artery systolic pressure. The estimated right ventricular systolic pressure is 36.3 mmHg.  3. Right atrial size was mildly dilated.  4. The mitral valve is normal in structure. Trivial mitral valve regurgitation. No evidence of mitral stenosis.  5. Tricuspid valve regurgitation is moderate.  6. The aortic valve number of cusps is indeterminant. There is moderate calcification of the aortic valve. There is moderate thickening of the aortic valve. Aortic valve regurgitation is moderate. Moderate to severe aortic valve stenosis. Aortic regurgitation PHT measures 442 to 546 msec. Aortic valve area, by VTI measures 0.81 cm. Aortic valve mean gradient measures 34.0 mmHg. Aortic valve Vmax measures 4.17 m/s. DVI 0.40. AVA is underestimated due to small LVOT diameter. Vmax increased due to  increaesd flow from significant AI.  7. The inferior  vena cava is normal in size with <50% respiratory variability, suggesting right atrial pressure of 8 mmHg. Conclusion(s)/Recommendation(s): No intracardiac source of embolism detected on this transthoracic study. Consider a transesophageal echocardiogram to exclude cardiac source of embolism if clinically indicated. FINDINGS  Left Ventricle: Left ventricular ejection fraction, by estimation, is 60 to 65%. The left ventricle has normal function. The left ventricle has no regional wall motion abnormalities. The left ventricular internal cavity size was normal in size. There is  mild left ventricular hypertrophy of the basal-septal segment. Left ventricular diastolic parameters are indeterminate. Elevated left ventricular end-diastolic pressure. Right Ventricle: The right ventricular size is mildly enlarged. No increase in right ventricular wall thickness. Right ventricular systolic function is normal. There is mildly elevated pulmonary artery systolic pressure. The tricuspid regurgitant velocity is 2.66 m/s, and with an assumed right atrial pressure of 8 mmHg, the estimated right ventricular systolic pressure is 36.3 mmHg. Left Atrium: Left atrial size was normal in size. Right Atrium: Right atrial size was mildly dilated. Pericardium: There is no evidence of pericardial effusion. Mitral Valve: The mitral valve is normal in structure. Trivial mitral valve regurgitation. No evidence of mitral valve stenosis. MV peak gradient, 2.1 mmHg. The mean mitral valve gradient is 1.0 mmHg. Tricuspid Valve: The tricuspid valve is normal in structure. Tricuspid valve regurgitation is moderate . No evidence of tricuspid stenosis. Aortic Valve: AVA is underestimated due to small LVOT diameter. Vmax increased due to increaesd flow from significant AI. The aortic valve has an indeterminant number of cusps. There is moderate calcification of the aortic valve. There is moderate thickening of the aortic valve. Aortic valve regurgitation  is moderate. Aortic regurgitation PHT measures 546 msec. Moderate aortic stenosis is present. Aortic valve mean gradient measures 34.0 mmHg. Aortic valve peak gradient measures 69.6 mmHg. Aortic valve area, by VTI measures 0.81 cm. Pulmonic Valve: The pulmonic valve was normal in structure. Pulmonic valve regurgitation is mild. No evidence of pulmonic stenosis. Aorta: The aortic root is normal in size and structure. Venous: The inferior vena cava is normal in size with less than 50% respiratory variability, suggesting right atrial pressure of 8 mmHg. IAS/Shunts: No atrial level shunt detected by color flow Doppler.  LEFT VENTRICLE PLAX 2D LVIDd:         4.10 cm     Diastology LVIDs:  2.70 cm     LV e' medial:    3.71 cm/s LV PW:         0.90 cm     LV E/e' medial:  20.6 LV IVS:        1.20 cm     LV e' lateral:   8.90 cm/s LVOT diam:     1.50 cm     LV E/e' lateral: 8.6 LV SV:         64 LV SV Index:   39 LVOT Area:     1.77 cm  LV Volumes (MOD) LV vol d, MOD A2C: 93.5 ml LV vol d, MOD A4C: 81.3 ml LV vol s, MOD A2C: 27.7 ml LV vol s, MOD A4C: 30.6 ml LV SV MOD A2C:     65.8 ml LV SV MOD A4C:     81.3 ml LV SV MOD BP:      61.2 ml RIGHT VENTRICLE             IVC RV Basal diam:  4.20 cm     IVC diam: 2.30 cm RV S prime:     12.10 cm/s TAPSE (M-mode): 1.9 cm LEFT ATRIUM             Index        RIGHT ATRIUM           Index LA diam:        3.30 cm 1.97 cm/m   RA Area:     19.30 cm LA Vol (A2C):   29.4 ml 17.59 ml/m  RA Volume:   54.40 ml  32.56 ml/m LA Vol (A4C):   40.6 ml 24.30 ml/m LA Biplane Vol: 34.5 ml 20.65 ml/m  AORTIC VALVE AV Area (Vmax):    0.67 cm AV Area (Vmean):   0.71 cm AV Area (VTI):     0.81 cm AV Vmax:           417.00 cm/s AV Vmean:          269.000 cm/s AV VTI:            0.792 m AV Peak Grad:      69.6 mmHg AV Mean Grad:      34.0 mmHg LVOT Vmax:         158.50 cm/s LVOT Vmean:        108.000 cm/s LVOT VTI:          0.364 m LVOT/AV VTI ratio: 0.46 AI PHT:            546 msec AR  Vena Contracta: 0.40 cm  AORTA Ao Root diam: 3.40 cm MITRAL VALVE               TRICUSPID VALVE MV Area (PHT): 3.17 cm    TR Peak grad:   28.3 mmHg MV Area VTI:   2.51 cm    TR Mean grad:   16.0 mmHg MV Peak grad:  2.1 mmHg    TR Vmax:        266.00 cm/s MV Mean grad:  1.0 mmHg    TR Vmean:       191.0 cm/s MV Vmax:       0.72 m/s MV Vmean:      42.0 cm/s   SHUNTS MV Decel Time: 239 msec    Systemic VTI:  0.36 m MV E velocity: 76.60 cm/s  Systemic Diam: 1.50 cm MV A velocity: 70.10 cm/s MV E/A ratio:  1.09 Armanda Magic MD  Electronically signed by Armanda Magic MD Signature Date/Time: 11/24/2022/2:02:43 PM    Final    MR BRAIN WO CONTRAST  Result Date: 11/23/2022 CLINICAL DATA:  Transient ischemic attack (TIA) Transient right eye vision loss. Neurology recommends MRI, ESR/CRP and admit to [facility]. EXAM: MRI HEAD WITHOUT CONTRAST TECHNIQUE: Multiplanar, multiecho pulse sequences of the brain and surrounding structures were obtained without intravenous contrast. COMPARISON:  CT November 22, 2022. FINDINGS: Brain: No acute infarction, hemorrhage, hydrocephalus, extra-axial collection or mass lesion. Very mild for age scattered T2/FLAIR hyperintensities in the white matter, nonspecific but compatible with mild chronic microvascular ischemic change. Vascular: Major arterial flow voids are maintained at the skull base. Skull and upper cervical spine: Normal marrow signal. Sinuses/Orbits: Mild paranasal sinus mucosal thickening. No acute orbital findings. Other: No mastoid effusions. IMPRESSION: No evidence of acute abnormality. Electronically Signed   By: Feliberto Harts M.D.   On: 11/23/2022 16:45    PHYSICAL EXAM  Temp:  [97.4 F (36.3 C)-98.2 F (36.8 C)] 97.4 F (36.3 C) (04/24 0756) Pulse Rate:  [61-63] 61 (04/24 0424) Resp:  [16-22] 19 (04/24 0424) BP: (128-166)/(44-56) 128/45 (04/24 1130) SpO2:  [93 %-95 %] 95 % (04/24 0101)  General - Well nourished, well developed, in no apparent  distress. Cardiovascular - Regular rhythm and rate.  Harsh ejection systolic murmur heard throughout precordium and in the neck  Mental Status -  Level of arousal and orientation to time, place, and person were intact. Language including expression, naming, repetition, comprehension was assessed and found intact. Attention span and concentration were normal. Recent and remote memory were intact. Fund of Knowledge was assessed and was intact.  Cranial Nerves II - XII - II - Visual field intact OU. III, IV, VI - Extraocular movements intact. V - Facial sensation intact bilaterally. VII - Facial movement intact bilaterally. VIII - Hearing & vestibular intact bilaterally. X - Palate elevates symmetrically. XI - Chin turning & shoulder shrug intact bilaterally. XII - Tongue protrusion intact.  Motor Strength - The patient's strength was normal in all extremities and pronator drift was absent.  Bulk was normal and fasciculations were absent.   Motor Tone - Muscle tone was assessed at the neck and appendages and was normal.  Sensory - Light touch, temperature/pinprick were assessed and were symmetrical.    Coordination - The patient had normal movements in the hands and feet with no ataxia or dysmetria.  Tremor was absent.  Gait and Station - deferred.  ASSESSMENT/PLAN [name] is a 89 y.o. male with history of ignificant of CAD with CABG, IIDM, CKD stage II, hypothyroidism, presented with transient vision loss.     amaurosis fugax   right eye likely due to symptomatic brachiocephalic origin stenosis Etiology: Large vessel disease  CT head No acute abnormality.  CTA head & neck -no LVO.  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  Right atria mildly  dilated  LDL 102 HgbA1c 6.0 on 11/10/2022 VTE prophylaxis -heparin subcu    Diet   Diet NPO time specified   Aspirin 81 mg daily prior to admission, now on aspirin 81 mg daily and Plavix 75 mg daily for 3 months.  Possibly may be longer depending on cerebral angiogram results tomorrow Therapy recommendations: Pending Disposition: Pending  Hypertension Home meds: Norvasc  10 mg, HCTZ 12.5 mg Stable Permissive hypertension (OK if < 220/120) but gradually normalize in 5-7 days Long-term BP goal normotensive  Hyperlipidemia Home meds: Atorvastatin 40, resumed in hospital LDL 102, goal < 70 Increased atorvastatin to 80 mg Continue statin at discharge   Other Stroke Risk Factors Advanced Age >/= 61  Coronary artery disease   Other Active Problems Hypothyroidism  Hospital day # 0 Gevena Mart DNP, ACNPC-AG  [facility]  STROKE MD NOTE :  I have personally obtained history,examined this patient, reviewed notes, independently viewed imaging studies, participated in medical decision making and plan of care.ROS completed by me personally and pertinent positives fully documented  I have made any additions or clarifications directly to the above note. Agree with note above.  He presented with sudden onset of transient right eye vision loss due to amaurosis fugax..CT angiogram shows high-grade right brachiocephalic artery origin stenosis which is likely symptomatic.  Remains at significant risk for recurrent strokes and vision loss.  Recommend dual antiplatelet therapy aspirin and Plavix for at least 3 months and if longer stent is done.  Check diagnostic catheter angiogram tomorrow to confirm stenosis recommend angioplasty stenting if stenosis is confirmed.  Aggressive risk factor modification.  Long discussion with patient and daughter at the bedside and answered questions.  Discussed with Dr. Corliss Skains interventional neuroradiologist and angiogram scheduled for tomorrow.  Greater  than 50% time during this 50-minute visit was spent on counseling and coordination of care about his vision loss and brachiocephalic artery stenosis and discussion about evaluation and treatment and answering questions.  Discussed with Dr. Lennox Laity, MD Medical Director [facility] Pager: [phone] 11/24/2022 3:46 PM  To contact Stroke Continuity provider, please refer to [email or web address]. After hours, contact General Neurology

## 2022-11-24 NOTE — Consult Note (Signed)
Chief Complaint: Patient was seen in consultation today for Cerebral artery stenosis--- Cerebral arteriogram Chief Complaint  Patient presents with   Loss of Vision   at the request of Dr Rollene Fare  Supervising Physician: Julieanne Cotton  Patient Status: [facility] - In-pt  History of Present Illness: Steve Mcgrath is a 89 y.o. male   CAD/CABG; DM; CKD Transient vision loss (Rt) Sxs occurred 4 days ago Rt vision loss maybe few hours No N/V; No numbness tingling; no speech changes Was seen by Ophthalmologist-- No eye changes  Ct 4/22:  IMPRESSION: 1. No acute intracranial process. 2. 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 3. Moderate stenosis at the origin of the right vertebral artery. No other hemodynamically significant stenosis in the neck. 4. No intracranial large vessel occlusion or significant stenosis. 5. Aortic atherosclerosis. 6. Emphysema.  MR yesterday:  IMPRESSION: No evidence of acute abnormality.  Request made for Cerebral arteriogram per Neurology Dr Corliss Skains has reviewed imaging and approves procedure Scheduled for later today  (ate breakfast)  Past Medical History:  Diagnosis Date   CAD (coronary artery disease)    DM (diabetes mellitus)    Enlarged prostate    Hypothyroid    Kidney stones    MI, acute, non ST segment elevation    Murmur     Past Surgical History:  Procedure Laterality Date   CORONARY ARTERY BYPASS GRAFT     TONSILLECTOMY      Allergies: Patient has no known allergies.  Medications: Prior to Admission medications   Medication Sig Start Date End Date Taking? Authorizing Provider  alendronate (FOSAMAX) 70 MG tablet Take by mouth. 09/10/21  Yes [provider]  amLODipine (NORVASC) 10 MG tablet Take 1 tablet by mouth daily. 09/27/22 11/04/23 Yes [provider]  aspirin EC 81 MG tablet Take 1 tablet by mouth daily. 08/17/16  Yes [provider]   atorvastatin (LIPITOR) 40 MG tablet Take 1 tablet by mouth at bedtime. 08/29/18  Yes [provider]  calcium carbonate (SUPER CALCIUM) 1500 (600 Ca) MG TABS tablet Take by mouth daily. 01/13/22  Yes [provider]  cholecalciferol (VITAMIN D3) 25 MCG (1000 UNIT) tablet Take 1,000 Units by mouth daily. 10/13/18  Yes [provider]  cyanocobalamin (VITAMIN B12) 1000 MCG tablet Take 1,000 mcg by mouth daily. 07/31/21  Yes [provider]  hydrochlorothiazide (MICROZIDE) 12.5 MG capsule Take 12.5 mg by mouth daily. 09/17/14  Yes [provider]  levothyroxine (SYNTHROID) 100 MCG tablet Take 100 mcg by mouth daily before breakfast. 08/05/22  Yes [provider]  omeprazole (PRILOSEC) 40 MG capsule Take 1 capsule by mouth daily. 12/15/21  Yes [provider]  COVID-19 At Home Antigen Test [facility] COVID-19 HOME TEST) KIT Use as directed Patient not taking: Reported on 11/23/2022 08/03/21   Gwenlyn Fudge, [facility]  COVID-19 At Home Antigen Test [facility] COVID-19 HOME TEST) KIT Use as directed Patient not taking: Reported on 11/23/2022 08/03/21   Gwenlyn Fudge, [facility]  molnupiravir EUA (LAGEVRIO) 200 MG CAPS capsule Take 4 capsules by mouth twice daily for 5 days Patient not taking: Reported on 11/23/2022 08/03/21   Cathren Laine, MD     History reviewed. No pertinent family history.  Social History   Socioeconomic History   Marital status: Married    Spouse name: Not on file   Number of children: Not on file   Years of education: Not on file  Highest education level: Not on file  Occupational History   Not on file  Tobacco Use   Smoking status: Former    Types: Cigarettes   Smokeless tobacco: Not on file  Substance and Sexual Activity   Alcohol use: Never   Drug use: Not on file   Sexual activity: Not on file  Other Topics Concern   Not on file  Social History Narrative   Not on file   Social Determinants of Health    Financial Resource Strain: Not on file  Food Insecurity: Patient Declined (11/23/2022)   Hunger Vital Sign    Worried About Running Out of Food in the Last Year: Patient declined    Ran Out of Food in the Last Year: Patient declined  Transportation Needs: No Transportation Needs (11/23/2022)   PRAPARE - Administrator, Civil Service (Medical): No    Lack of Transportation (Non-Medical): No  Physical Activity: Not on file  Stress: Not on file  Social Connections: Not on file     Review of Systems: A 12 point ROS discussed and pertinent positives are indicated in the HPI above.  All other systems are negative.  Review of Systems  Constitutional:  Negative for activity change, appetite change, fatigue and fever.  HENT:  Negative for tinnitus and trouble swallowing.   Eyes:  Negative for photophobia and visual disturbance.  Respiratory:  Negative for cough and shortness of breath.   Cardiovascular:  Negative for chest pain.  Gastrointestinal:  Negative for abdominal pain, diarrhea, nausea and vomiting.  Musculoskeletal:  Negative for back pain and gait problem.  Neurological:  Negative for dizziness, tremors, seizures, syncope, facial asymmetry, speech difficulty, weakness, light-headedness, numbness and headaches.  Psychiatric/Behavioral:  Negative for behavioral problems and confusion.     Vital Signs: BP (!) 143/50 (BP Location: Right Arm)   Pulse 61   Temp (!) 97.4 F (36.3 C) (Oral)   Resp 19   Ht  (1.626 m)   Wt 138 lb (62.6 kg)   SpO2 95%   BMI 23.69 kg/m     Physical Exam Vitals reviewed.  HENT:     Mouth/Throat:     Mouth: Mucous membranes are moist.  Cardiovascular:     Rate and Rhythm: Normal rate and regular rhythm.     Heart sounds: Normal heart sounds.  Musculoskeletal:        General: Normal range of motion.  Skin:    General: Skin is dry.  Neurological:     Mental Status: He is alert and oriented to person, place, and time.   Psychiatric:        Behavior: Behavior normal.     Imaging: MR BRAIN WO CONTRAST  Result Date: 11/23/2022 CLINICAL DATA:  Transient ischemic attack (TIA) Transient right eye vision loss. Neurology recommends MRI, ESR/CRP and admit to [facility]. EXAM: MRI HEAD WITHOUT CONTRAST TECHNIQUE: Multiplanar, multiecho pulse sequences of the brain and surrounding structures were obtained without intravenous contrast. COMPARISON:  CT November 22, 2022. FINDINGS: Brain: No acute infarction, hemorrhage, hydrocephalus, extra-axial collection or mass lesion. Very mild for age scattered T2/FLAIR hyperintensities in the white matter, nonspecific but compatible with mild chronic microvascular ischemic change. Vascular: Major arterial flow voids are maintained at the skull base. Skull and upper cervical spine: Normal marrow signal. Sinuses/Orbits: Mild paranasal sinus mucosal thickening. No acute orbital findings. Other: No mastoid effusions. IMPRESSION: No evidence of acute abnormality. Electronically Signed   By: Juluis Mire.D.  On: 11/23/2022 16:45   CT ANGIO HEAD NECK W WO CM  Result Date: 11/22/2022 CLINICAL DATA:  Transient vision loss in right eye on Friday EXAM: CT ANGIOGRAPHY HEAD AND NECK WITH AND WITHOUT CONTRAST TECHNIQUE: Multidetector CT imaging of the head and neck was performed using the standard protocol during bolus administration of intravenous contrast. Multiplanar CT image reconstructions and MIPs were obtained to evaluate the vascular anatomy. Carotid stenosis measurements (when applicable) are obtained utilizing NASCET criteria, using the distal internal carotid diameter as the denominator. RADIATION DOSE REDUCTION: This exam was performed according to the departmental dose-optimization program which includes automated exposure control, adjustment of the mA and/or kV according to patient size and/or use of iterative reconstruction technique. CONTRAST:  75mL OMNIPAQUE IOHEXOL 350 MG/ML SOLN  COMPARISON:  No prior CTA available, correlation is made with 04/14/2021 CT head FINDINGS: CT HEAD FINDINGS Brain: No evidence of acute infarct, hemorrhage, mass, mass effect, or midline shift. No hydrocephalus or extra-axial fluid collection. Periventricular white matter changes, likely the sequela of chronic small vessel ischemic disease. Remote lacunar infarcts in the bilateral basal ganglia. Normal cerebral volume for age. Vascular: No hyperdense vessel. Skull: Negative for fracture or focal lesion. Sinuses/Orbits: Mucosal thickening in the ethmoid air cells. Status post bilateral lens replacements. Other: The mastoid air cells are well aerated. CTA NECK FINDINGS Aortic arch: Standard branching. Imaged portion shows no evidence of aneurysm or dissection. Aortic atherosclerosis. 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 Atherosclerotic plaque at the origin of the left common carotid and left subclavian arteries is not hemodynamically significant. Right carotid system: No evidence of dissection, occlusion, or hemodynamically significant stenosis (greater than 50%). Left carotid system: No evidence of dissection, occlusion, or hemodynamically significant stenosis (greater than 50%). Vertebral arteries: Moderate stenosis at the origin of the right vertebral artery. No other significant stenosis in the vertebral arteries. No evidence of dissection. Skeleton: No acute osseous abnormality. Degenerative changes in the cervical spine. Other neck: Negative. Upper chest: No focal pulmonary opacity or pleural effusion. Apical pleural-parenchymal scarring. Paraseptal emphysema. Review of the MIP images confirms the above findings CTA HEAD FINDINGS Anterior circulation: Both internal carotid arteries are patent to the termini, with mild stenosis in the bilateral supraclinoid segments. A1 segments patent, somewhat hypoplastic on the  right. Normal anterior communicating artery. Anterior cerebral arteries are patent to their distal aspects without significant stenosis. No M1 stenosis or occlusion. MCA branches perfused to their distal aspects without significant stenosis. Posterior circulation: Vertebral arteries patent to the vertebrobasilar junction without significant stenosis. Posterior inferior cerebellar arteries patent proximally. Basilar patent to its distal aspect without significant stenosis. Superior cerebellar arteries patent proximally. Patent right P1. Fetal origin of the left PCA from the left posterior communicating artery. PCAs perfused to their distal aspects without significant stenosis. Venous sinuses: As permitted by contrast timing, patent. Anatomic variants: Fetal origin of the left PCA. Review of the MIP images confirms the above findings IMPRESSION: 1. No acute intracranial process. 2. 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 3. Moderate stenosis at the origin of the right vertebral artery. No other hemodynamically significant stenosis in the neck. 4. No intracranial large vessel occlusion or significant stenosis. 5. Aortic atherosclerosis. 6. Emphysema. Aortic Atherosclerosis (ICD10-I70.0) and Emphysema (ICD10-J43.9). Electronically Signed   By: Wiliam Ke M.D.   On: 11/22/2022 19:12    Labs:  CBC:  Recent Labs    11/22/22 1601  WBC 6.6  HGB 11.8*  HCT 35.2*  PLT 175    COAGS: Recent Labs    11/22/22 1617  INR 1.1  APTT 32    BMP: Recent Labs    11/22/22 1601 11/24/22 0321  NA 137 137  K 4.2 4.0  CL 106 104  CO2 23 22  GLUCOSE 129* 99  BUN 38* 27*  CALCIUM 8.9 9.2  CREATININE 1.36* 1.42*  GFRNONAA 49* 46*    LIVER FUNCTION TESTS: Recent Labs    11/22/22 1601  BILITOT 0.4  AST 18  ALT 14  ALKPHOS 64  PROT 7.0  ALBUMIN 3.9    TUMOR MARKERS: No results for input(s): "AFPTM", "CEA", "CA199", "CHROMGRNA" in the last 8760  hours.  Assessment and Plan:  Scheduled for Cerebral arteriogram in IR today Risks and benefits of cerebral angiogram with intervention were discussed with the patient including, but not limited to bleeding, infection, vascular injury, contrast induced renal failure, stroke or even death.  This interventional procedure involves the use of X-rays and because of the nature of the planned procedure, it is possible that we will have prolonged use of X-ray fluoroscopy.  Potential radiation risks to you include (but are not limited to) the following: - A slightly elevated risk for cancer  several years later in life. This risk is typically less than 0.5% percent. This risk is low in comparison to the normal incidence of human cancer, which is 33% for women and 50% for men according to the [employer]. - Radiation induced injury can include skin redness, resembling a rash, tissue breakdown / ulcers and hair loss (which can be temporary or permanent).   The likelihood of either of these occurring depends on the difficulty of the procedure and whether you are sensitive to radiation due to previous procedures, disease, or genetic conditions.   IF your procedure requires a prolonged use of radiation, you will be notified and given written instructions for further action.  It is your responsibility to monitor the irradiated area for the 2 weeks following the procedure and to notify your physician if you are concerned that you have suffered a radiation induced injury.    All of the patient's questions were answered, patient is agreeable to proceed.  Consent signed and in chart.  Thank you for this interesting consult.  I greatly enjoyed meeting PEDRAM GOODCHILD and look forward to participating in their care.  A copy of this report was sent to the requesting provider on this date.  Electronically Signed: Robet Leu, PA-C 11/24/2022, 9:54 AM   I spent a total of 40 Minutes    in face to  face in clinical consultation, greater than 50% of which was counseling/coordinating care for Cerebral arteriogram

## 2022-11-24 NOTE — Progress Notes (Signed)
PROGRESS NOTE        PATIENT DETAILS Name: Steve Mcgrath Age: 89 y.o. Sex: male Date of Birth: 04-24-1927 Admit Date: 11/22/2022 Admitting Physician Emeline General, MD WGN:FAOZHY, Diana Eves., MD  Brief Summary: Patient is a 89 y.o.  male history of CAD s/p CABG, DM-2 who presented with transient right-sided visual loss.  Significant events: 4/22>> admitted to [facility]  Significant studies: 4/22>> CRP: 0.6 4/22>> ESR: 24 4/22>> CTA head/neck: No intracranial large vessel occlusion, apart from moderate stenosis of the right vertebral artery-no hemodynamically significant stenosis in the neck.  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 4/23>> MRI brain: No CVA 4/24>> LDL 102 4/24>> A1c: Pending  Significant microbiology data: None  Procedures: None  Consults: Neurology Interventional radiology  Subjective: Lying comfortably in bed-denies any chest pain or shortness of breath.  Objective: Vitals: Blood pressure (!) 143/50, pulse 61, temperature (!) 97.4 F (36.3 C), temperature source Oral, resp. rate 19, height 5\' 4"  (1.626 m), weight 62.6 kg, SpO2 95 %.   Exam: Gen Exam:Alert awake-not in any distress HEENT:atraumatic, normocephalic Chest: B/L clear to auscultation anteriorly CVS:S1S2 regular Abdomen:soft non tender, non distended Extremities:no edema Neurology: Non focal Skin: no rash  Pertinent Labs/Radiology:    Latest Ref Rng & Units 11/22/2022    4:01 PM 04/14/2021    9:49 PM  CBC  WBC 4.0 - 10.5 K/uL 6.6  5.3   Hemoglobin 13.0 - 17.0 g/dL 86.5  78.4   Hematocrit 39.0 - 52.0 % 35.2  33.8   Platelets 150 - 400 K/uL 175  175     Lab Results  Component Value Date   NA 137 11/24/2022   K 4.0 11/24/2022   CL 104 11/24/2022   CO2 22 11/24/2022      Assessment/Plan: TIA-right amaurosis fugax No visual complaints overnight-nonfocal exam Workup as above IR planning cerebral catheter angiogram today Continue  aspirin/Plavix/statin Echo pending Await further recommendations from stroke MD/IR MD  HTN BP stable HCTZ/amlodipine  History of CAD s/p three-vessel CABG 2001 No anginal symptom On antiplatelet/statin  History of moderate aortic stenosis Followed by cardiology-in the outpatient setting  DM-2 Diet controlled at home Await A1c  Hypothyroidism Synthroid  GERD PPI  CKD stage IIIa At baseline Hold HCTZ-as angiogram planned  BMI: Estimated body mass index is 23.69 kg/m as calculated from the following:   Height as of this encounter: 5\' 4"  (1.626 m).   Weight as of this encounter: 62.6 kg.   Code status:   Code Status: Full Code   DVT Prophylaxis: heparin injection 5,000 Units Start: 11/25/22 0800   Family Communication: Spouse at bedside   Disposition Plan: Status is: Observation The patient will require care spanning > 2 midnights and should be moved to inpatient because: Severity of illness   Planned Discharge Destination:Home   Diet: Diet Order             Diet NPO time specified  Diet effective now                     Antimicrobial agents: Anti-infectives (From admission, onward)    None        MEDICATIONS: Scheduled Meds:   stroke: early stages of recovery book   Does not apply Once   amLODipine  10 mg Oral Daily   aspirin EC  81 mg Oral Daily   atorvastatin  80 mg Oral QHS   clopidogrel  75 mg Oral Daily   [START ON 11/25/2022] heparin  5,000 Units Subcutaneous [id number]   hydrochlorothiazide  12.5 mg Oral Daily   levothyroxine  100 mcg Oral Q0600   pantoprazole  40 mg Oral Daily   Continuous Infusions: PRN Meds:.acetaminophen **OR** acetaminophen (TYLENOL) oral liquid 160 mg/5 mL **OR** acetaminophen, senna-docusate   I have personally reviewed following labs and imaging studies  LABORATORY DATA: CBC: Recent Labs  Lab 11/22/22 1601  WBC 6.6  NEUTROABS 3.9  HGB 11.8*  HCT 35.2*  MCV 90.0  PLT 175    Basic  Metabolic Panel: Recent Labs  Lab 11/22/22 1601 11/24/22 0321  NA 137 137  K 4.2 4.0  CL 106 104  CO2 23 22  GLUCOSE 129* 99  BUN 38* 27*  CREATININE 1.36* 1.42*  CALCIUM 8.9 9.2    GFR: Estimated Creatinine Clearance: 27.2 mL/min (A) (by C-G formula based on SCr of 1.42 mg/dL (H)).  Liver Function Tests: Recent Labs  Lab 11/22/22 1601  AST 18  ALT 14  ALKPHOS 64  BILITOT 0.4  PROT 7.0  ALBUMIN 3.9   No results for input(s): "LIPASE", "AMYLASE" in the last 168 hours. No results for input(s): "AMMONIA" in the last 168 hours.  Coagulation Profile: Recent Labs  Lab 11/22/22 1617  INR 1.1    Cardiac Enzymes: No results for input(s): "CKTOTAL", "CKMB", "CKMBINDEX", "TROPONINI" in the last 168 hours.  BNP (last 3 results) No results for input(s): "PROBNP" in the last 8760 hours.  Lipid Profile: Recent Labs    11/24/22 0321  CHOL 166  HDL 49  LDLCALC 102*  TRIG 75  CHOLHDL 3.4    Thyroid Function Tests: No results for input(s): "TSH", "T4TOTAL", "FREET4", "T3FREE", "THYROIDAB" in the last 72 hours.  Anemia Panel: No results for input(s): "VITAMINB12", "FOLATE", "FERRITIN", "TIBC", "IRON", "RETICCTPCT" in the last 72 hours.  Urine analysis: No results found for: "COLORURINE", "APPEARANCEUR", "LABSPEC", "PHURINE", "GLUCOSEU", "HGBUR", "BILIRUBINUR", "KETONESUR", "PROTEINUR", "UROBILINOGEN", "NITRITE", "LEUKOCYTESUR"  Sepsis Labs: Lactic Acid, Venous No results found for: "LATICACIDVEN"  MICROBIOLOGY: No results found for this or any previous visit (from the past 240 hour(s)).  RADIOLOGY STUDIES/RESULTS: MR BRAIN WO CONTRAST  Result Date: 11/23/2022 CLINICAL DATA:  Transient ischemic attack (TIA) Transient right eye vision loss. Neurology recommends MRI, ESR/CRP and admit to [facility]. EXAM: MRI HEAD WITHOUT CONTRAST TECHNIQUE: Multiplanar, multiecho pulse sequences of the brain and surrounding structures were obtained without intravenous contrast.  COMPARISON:  CT November 22, 2022. FINDINGS: Brain: No acute infarction, hemorrhage, hydrocephalus, extra-axial collection or mass lesion. Very mild for age scattered T2/FLAIR hyperintensities in the white matter, nonspecific but compatible with mild chronic microvascular ischemic change. Vascular: Major arterial flow voids are maintained at the skull base. Skull and upper cervical spine: Normal marrow signal. Sinuses/Orbits: Mild paranasal sinus mucosal thickening. No acute orbital findings. Other: No mastoid effusions. IMPRESSION: No evidence of acute abnormality. Electronically Signed   By: Feliberto Harts M.D.   On: 11/23/2022 16:45   CT ANGIO HEAD NECK W WO CM  Result Date: 11/22/2022 CLINICAL DATA:  Transient vision loss in right eye on Friday EXAM: CT ANGIOGRAPHY HEAD AND NECK WITH AND WITHOUT CONTRAST TECHNIQUE: Multidetector CT imaging of the head and neck was performed using the standard protocol during bolus administration of intravenous contrast. Multiplanar CT image reconstructions and MIPs were obtained to evaluate the vascular anatomy. Carotid stenosis measurements (when  applicable) are obtained utilizing NASCET criteria, using the distal internal carotid diameter as the denominator. RADIATION DOSE REDUCTION: This exam was performed according to the departmental dose-optimization program which includes automated exposure control, adjustment of the mA and/or kV according to patient size and/or use of iterative reconstruction technique. CONTRAST:  75mL OMNIPAQUE IOHEXOL 350 MG/ML SOLN COMPARISON:  No prior CTA available, correlation is made with 04/14/2021 CT head FINDINGS: CT HEAD FINDINGS Brain: No evidence of acute infarct, hemorrhage, mass, mass effect, or midline shift. No hydrocephalus or extra-axial fluid collection. Periventricular white matter changes, likely the sequela of chronic small vessel ischemic disease. Remote lacunar infarcts in the bilateral basal ganglia. Normal cerebral volume  for age. Vascular: No hyperdense vessel. Skull: Negative for fracture or focal lesion. Sinuses/Orbits: Mucosal thickening in the ethmoid air cells. Status post bilateral lens replacements. Other: The mastoid air cells are well aerated. CTA NECK FINDINGS Aortic arch: Standard branching. Imaged portion shows no evidence of aneurysm or dissection. Aortic atherosclerosis. 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 Atherosclerotic plaque at the origin of the left common carotid and left subclavian arteries is not hemodynamically significant. Right carotid system: No evidence of dissection, occlusion, or hemodynamically significant stenosis (greater than 50%). Left carotid system: No evidence of dissection, occlusion, or hemodynamically significant stenosis (greater than 50%). Vertebral arteries: Moderate stenosis at the origin of the right vertebral artery. No other significant stenosis in the vertebral arteries. No evidence of dissection. Skeleton: No acute osseous abnormality. Degenerative changes in the cervical spine. Other neck: Negative. Upper chest: No focal pulmonary opacity or pleural effusion. Apical pleural-parenchymal scarring. Paraseptal emphysema. Review of the MIP images confirms the above findings CTA HEAD FINDINGS Anterior circulation: Both internal carotid arteries are patent to the termini, with mild stenosis in the bilateral supraclinoid segments. A1 segments patent, somewhat hypoplastic on the right. Normal anterior communicating artery. Anterior cerebral arteries are patent to their distal aspects without significant stenosis. No M1 stenosis or occlusion. MCA branches perfused to their distal aspects without significant stenosis. Posterior circulation: Vertebral arteries patent to the vertebrobasilar junction without significant stenosis. Posterior inferior cerebellar arteries patent proximally. Basilar patent  to its distal aspect without significant stenosis. Superior cerebellar arteries patent proximally. Patent right P1. Fetal origin of the left PCA from the left posterior communicating artery. PCAs perfused to their distal aspects without significant stenosis. Venous sinuses: As permitted by contrast timing, patent. Anatomic variants: Fetal origin of the left PCA. Review of the MIP images confirms the above findings IMPRESSION: 1. No acute intracranial process. 2. 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 3. Moderate stenosis at the origin of the right vertebral artery. No other hemodynamically significant stenosis in the neck. 4. No intracranial large vessel occlusion or significant stenosis. 5. Aortic atherosclerosis. 6. Emphysema. Aortic Atherosclerosis (ICD10-I70.0) and Emphysema (ICD10-J43.9). Electronically Signed   By: Wiliam Ke M.D.   On: 11/22/2022 19:12     LOS: 0 days   Jeoffrey Massed, MD  [facility]    To contact the attending provider between 7A-7P or the covering provider during after hours 7P-7A, please log into the web site [email or web address] and access using universal [place] password for that web site. If you do not have the password, please call the hospital operator.  11/24/2022, 10:12 AM

## 2022-11-25 ENCOUNTER — Inpatient Hospital Stay (HOSPITAL_COMMUNITY): Payer: Medicare Other

## 2022-11-25 DIAGNOSIS — G459 Transient cerebral ischemic attack, unspecified: Secondary | ICD-10-CM | POA: Diagnosis not present

## 2022-11-25 DIAGNOSIS — E039 Hypothyroidism, unspecified: Secondary | ICD-10-CM

## 2022-11-25 DIAGNOSIS — I10 Essential (primary) hypertension: Secondary | ICD-10-CM | POA: Diagnosis not present

## 2022-11-25 LAB — HEMOGLOBIN A1C
Hgb A1c MFr Bld: 6 % — ABNORMAL HIGH (ref 4.8–5.6)
Mean Plasma Glucose: 126 mg/dL

## 2022-11-25 LAB — HEPARIN LEVEL (UNFRACTIONATED): Heparin Unfractionated: 0.13 IU/mL — ABNORMAL LOW (ref 0.30–0.70)

## 2022-11-25 LAB — GLUCOSE, CAPILLARY: Glucose-Capillary: 109 mg/dL — ABNORMAL HIGH (ref 70–99)

## 2022-11-25 MED ORDER — CLOPIDOGREL BISULFATE 75 MG PO TABS
300.0000 mg | ORAL_TABLET | Freq: Once | ORAL | Status: AC
  Administered 2022-11-25: 300 mg via ORAL
  Filled 2022-11-25: qty 4

## 2022-11-25 MED ORDER — SODIUM CHLORIDE 0.9 % IV BOLUS
500.0000 mL | Freq: Once | INTRAVENOUS | Status: AC
  Administered 2022-11-25: 500 mL via INTRAVENOUS

## 2022-11-25 MED ORDER — CLOPIDOGREL BISULFATE 75 MG PO TABS
75.0000 mg | ORAL_TABLET | Freq: Every day | ORAL | Status: DC
  Administered 2022-11-26: 75 mg via ORAL
  Filled 2022-11-25: qty 1

## 2022-11-25 MED ORDER — SODIUM CHLORIDE 0.9 % IV SOLN: INTRAVENOUS | Status: DC

## 2022-11-25 MED ORDER — HEPARIN (PORCINE) 25000 UT/250ML-% IV SOLN
850.0000 [IU]/h | INTRAVENOUS | Status: DC
  Administered 2022-11-25: 750 [IU]/h via INTRAVENOUS
  Filled 2022-11-25: qty 250

## 2022-11-25 NOTE — Progress Notes (Signed)
ANTICOAGULATION CONSULT NOTE - Initial Consult  Pharmacy Consult for Heparin Indication: stroke protocol  No Known Allergies  Patient Measurements: Height:  (162.6 cm) Weight: 62.6 kg (138 lb) IBW/kg (Calculated) : 59.2 Heparin Dosing Weight: 62.6 kg  Vital Signs: Temp: 97.2 F (36.2 C) (04/25 0424) Temp Source: Oral (04/25 0424) BP: 159/60 (04/25 1050) Pulse Rate: 65 (04/25 1050)  Labs: Recent Labs    11/22/22 1601 11/22/22 1617 11/24/22 0321  HGB 11.8*  --   --   HCT 35.2*  --   --   PLT 175  --   --   APTT  --  32  --   LABPROT  --  13.7  --   INR  --  1.1  --   CREATININE 1.36*  --  1.42*    Estimated Creatinine Clearance: 27.2 mL/min (A) (by C-G formula based on SCr of 1.42 mg/dL (H)).   Medical History: Past Medical History:  Diagnosis Date   CAD (coronary artery disease)    DM (diabetes mellitus)    Enlarged prostate    Hypothyroid    Kidney stones    MI, acute, non ST segment elevation    Murmur    Assessment: 89 yr old male to begin IV heparin per stroke protocol.  Waxing and waning vision loss right eye noted.  Planning Neuro-intervention today.   Prior heparin 5000 units SQ Q8h.  Last dose 4/24 am.  Held 4/24 for possible neuro-intervention but unable to schedule.   On ASA 81 mg PTA, Plavix added on admit. Re-loaded today. Hgb 11.8, platelet count 175 on admit.  Goal of Therapy:  Heparin level 0.3-0.5 units/ml Monitor platelets by anticoagulation protocol: Yes   Plan:  Begin heparin drip without bolus at 750 units/hr (~12 units/kg/hr) Heparin level ordered for ~8 hrs after drip begins, but will follow up after Neuro-IR procedure for further plans.  Daily heparin level and CBC while on heparin.  Dennie Fetters, RPh 11/25/2022,11:23 AM

## 2022-11-25 NOTE — Progress Notes (Signed)
ANTICOAGULATION CONSULT NOTE - Follow-up  Pharmacy Consult for Heparin Indication: stroke protocol  No Known Allergies  Patient Measurements: Height:  (162.6 cm) Weight: 62.6 kg (138 lb) IBW/kg (Calculated) : 59.2 Heparin Dosing Weight: 62.6 kg  Vital Signs: Temp: 98.3 F (36.8 C) (04/25 1700) Temp Source: Oral (04/25 1700) BP: 161/60 (04/25 1700) Pulse Rate: 67 (04/25 1700)  Labs: Recent Labs    11/24/22 0321 11/25/22 1904  HEPARINUNFRC  --  0.13*  CREATININE 1.42*  --      Estimated Creatinine Clearance: 27.2 mL/min (A) (by C-G formula based on SCr of 1.42 mg/dL (H)).   Medical History: Past Medical History:  Diagnosis Date   CAD (coronary artery disease)    DM (diabetes mellitus) (HCC)    Enlarged prostate    Hypothyroid    Kidney stones    MI, acute, non ST segment elevation (HCC)    Murmur    Assessment: 89 yr old male to begin IV heparin per stroke protocol.  Waxing and waning vision loss right eye noted.  Planning Neuro-intervention today.   Prior heparin 5000 units SQ Q8h.  Last dose 4/24 am.  Held 4/24 for possible neuro-intervention but unable to schedule.   On ASA 81 mg PTA, Plavix added on admit. Re-loaded today. Hgb 11.8, platelet count 175 on admit.  4/25 PM update: HL 0.13 No signs of bleeding. No issues with the infusion noted  Goal of Therapy:  Heparin level 0.3-0.5 units/ml Monitor platelets by anticoagulation protocol: Yes   Plan:  Increase heparin infusion to 850 units/hr Heparin level with AM labs Follow up after Neuro-IR procedure for further plans.  Daily heparin level and CBC while on heparin.  Greta Doom BS, PharmD, BCPS Clinical Pharmacist 11/25/2022 8:25 PM  Contact: [phone] after 3 PM  "Be curious, not judgmental..." -Debbora Dus

## 2022-11-25 NOTE — Progress Notes (Signed)
PROGRESS NOTE        PATIENT DETAILS Name: Steve Mcgrath Age: 89 y.o. Sex: male Date of Birth: 22-Sep-1926 Admit Date: 11/22/2022 Admitting Physician Dewayne Shorter Levora Dredge, MD RUE:[id number], Diana Eves., MD  Brief Summary: Patient is a 89 y.o.  male history of CAD s/p CABG, DM-2 who presented with transient right-sided visual loss-upon further evaluation with imaging studies-this was felt to be secondary to symptomatic high-grade right brachiocephalic artery stenosis.  See below for further details.    Significant events: 4/22>> admitted to [facility]-transient right-sided visual loss  4/25>> recurrent/waxing/waning visual loss right eye overnight-repeat CT head negative-loaded with Plavix-IV heparin being started-IR planning catheter angiogram/stent placement  Significant studies: 4/22>> CRP: 0.6 4/22>> ESR: 24 4/22>> CTA head/neck: No intracranial large vessel occlusion, apart from moderate stenosis of the right vertebral artery-no hemodynamically significant stenosis in the neck.  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 4/23>> MRI brain: No CVA 4/24>> LDL 102 4/24>> A1c: 6.0 4/24>> echo: EF 60-65%-moderate to severe aortic valve stenosis 4/25> repeat CT head: No acute intracranial abnormality.  Significant microbiology data: None  Procedures: None  Consults: Neurology Interventional radiology  Subjective: Has been pain waxing/waning right eye visual issues overnight.  Objective: Vitals: Blood pressure (!) 154/55, pulse 66, temperature (!) 97.2 F (36.2 C), temperature source Oral, resp. rate (!) 21, height 5\' 4"  (1.626 m), weight 62.6 kg, SpO2 94 %.   Exam: Gen Exam:Alert awake-not in any distress HEENT:atraumatic, normocephalic Chest: B/L clear to auscultation anteriorly CVS:S1S2 regular Abdomen:soft non tender, non distended Extremities:no edema Neurology: Non focal Skin: no rash  Pertinent Labs/Radiology:    Latest Ref Rng &  Units 11/22/2022    4:01 PM 04/14/2021    9:49 PM  CBC  WBC 4.0 - 10.5 K/uL 6.6  5.3   Hemoglobin 13.0 - 17.0 g/dL 11.9  14.7   Hematocrit 39.0 - 52.0 % 35.2  33.8   Platelets 150 - 400 K/uL 175  175     Lab Results  Component Value Date   NA 137 11/24/2022   K 4.0 11/24/2022   CL 104 11/24/2022   CO2 22 11/24/2022      Assessment/Plan: TIA-right amaurosis fugax-secondary to symptomatic right brachiocephalic artery origin stenosis Unfortunately has had waxing/waning right eye visual loss overnight.  No other neurological deficits on exam. Repeat CT head 4/25-negative for ICH Loaded with Plavix-IV heparin to be started by neurology Remains on aspirin/statin IR planning catheter cerebral angiogram and possible stent placement later today   HTN BP stable Continue amlodipine HCTZ on hold due to potential for angiogram later today.  History of CAD s/p three-vessel CABG 2001 No anginal symptom On antiplatelet/statin  History of moderate aortic stenosis Repeat echo confirms moderate-severe aortic stenosis on echo-patient is followed by cardiology at [facility].  DM-2 Diet controlled at home  Hypothyroidism Synthroid  GERD PPI  CKD stage IIIa At baseline Hold HCTZ-as angiogram planned  BMI: Estimated body mass index is 23.69 kg/m as calculated from the following:   Height as of this encounter: 5\' 4"  (1.626 m).   Weight as of this encounter: 62.6 kg.   Code status:   Code Status: Full Code   DVT Prophylaxis: heparin injection 5,000 Units Start: 11/25/22 0800   Family Communication: Spouse at bedside   Disposition Plan: Status is: Observation The patient will  require care spanning > 2 midnights and should be moved to inpatient because: Severity of illness   Planned Discharge Destination:Home   Diet: Diet Order             Diet heart healthy/carb modified Room service appropriate? Yes; Fluid consistency: Thin  Diet effective now                      Antimicrobial agents: Anti-infectives (From admission, onward)    None        MEDICATIONS: Scheduled Meds:  amLODipine  10 mg Oral Daily   aspirin EC  81 mg Oral Daily   atorvastatin  80 mg Oral QHS   [START ON 11/26/2022] clopidogrel  75 mg Oral Daily   heparin  5,000 Units Subcutaneous [id number]   levothyroxine  100 mcg Oral Q0600   pantoprazole  40 mg Oral Daily   Continuous Infusions:  sodium chloride     sodium chloride     PRN Meds:.acetaminophen **OR** acetaminophen (TYLENOL) oral liquid 160 mg/5 mL **OR** acetaminophen, senna-docusate   I have personally reviewed following labs and imaging studies  LABORATORY DATA: CBC: Recent Labs  Lab 11/22/22 1601  WBC 6.6  NEUTROABS 3.9  HGB 11.8*  HCT 35.2*  MCV 90.0  PLT 175     Basic Metabolic Panel: Recent Labs  Lab 11/22/22 1601 11/24/22 0321  NA 137 137  K 4.2 4.0  CL 106 104  CO2 23 22  GLUCOSE 129* 99  BUN 38* 27*  CREATININE 1.36* 1.42*  CALCIUM 8.9 9.2     GFR: Estimated Creatinine Clearance: 27.2 mL/min (A) (by C-G formula based on SCr of 1.42 mg/dL (H)).  Liver Function Tests: Recent Labs  Lab 11/22/22 1601  AST 18  ALT 14  ALKPHOS 64  BILITOT 0.4  PROT 7.0  ALBUMIN 3.9    No results for input(s): "LIPASE", "AMYLASE" in the last 168 hours. No results for input(s): "AMMONIA" in the last 168 hours.  Coagulation Profile: Recent Labs  Lab 11/22/22 1617  INR 1.1     Cardiac Enzymes: No results for input(s): "CKTOTAL", "CKMB", "CKMBINDEX", "TROPONINI" in the last 168 hours.  BNP (last 3 results) No results for input(s): "PROBNP" in the last 8760 hours.  Lipid Profile: Recent Labs    11/24/22 0321  CHOL 166  HDL 49  LDLCALC 102*  TRIG 75  CHOLHDL 3.4     Thyroid Function Tests: No results for input(s): "TSH", "T4TOTAL", "FREET4", "T3FREE", "THYROIDAB" in the last 72 hours.  Anemia Panel: No results for input(s): "VITAMINB12", "FOLATE",  "FERRITIN", "TIBC", "IRON", "RETICCTPCT" in the last 72 hours.  Urine analysis: No results found for: "COLORURINE", "APPEARANCEUR", "LABSPEC", "PHURINE", "GLUCOSEU", "HGBUR", "BILIRUBINUR", "KETONESUR", "PROTEINUR", "UROBILINOGEN", "NITRITE", "LEUKOCYTESUR"  Sepsis Labs: Lactic Acid, Venous No results found for: "LATICACIDVEN"  MICROBIOLOGY: No results found for this or any previous visit (from the past 240 hour(s)).  RADIOLOGY STUDIES/RESULTS: ECHOCARDIOGRAM COMPLETE  Result Date: 11/24/2022    ECHOCARDIOGRAM REPORT   Patient Name:   SCOTTIE METAYER Date of Exam: 11/24/2022 Medical Rec #:  [id number]     Height:       64.0 in Accession #:    [id number]    Weight:       138.0 lb Date of Birth:  07-06-1927    BSA:          1.671 m Patient Age:    89 years      BP:  128/45 mmHg Patient Gender: M             HR:           60 bpm. Exam Location:  Inpatient Procedure: 2D Echo, Color Doppler and Cardiac Doppler REPORT CONTAINS CRITICAL RESULT Indications:    Stroke  History:        Patient has no prior history of Echocardiogram examinations. CAD                 and Previous Myocardial Infarction, Prior CABG, TIA,                 Signs/Symptoms:Murmur; Risk Factors:Diabetes.  Sonographer:    Wallie Char Referring Phys: Lennox Solders [place] IMPRESSIONS  1. Left ventricular ejection fraction, by estimation, is 60 to 65%. The left ventricle has normal function. The left ventricle has no regional wall motion abnormalities. There is mild left ventricular hypertrophy of the basal-septal segment. Left ventricular diastolic parameters are indeterminate. Elevated left ventricular end-diastolic pressure.  2. Right ventricular systolic function is normal. The right ventricular size is mildly enlarged. There is mildly elevated pulmonary artery systolic pressure. The estimated right ventricular systolic pressure is 36.3 mmHg.  3. Right atrial size was mildly dilated.  4. The mitral valve is normal in structure.  Trivial mitral valve regurgitation. No evidence of mitral stenosis.  5. Tricuspid valve regurgitation is moderate.  6. The aortic valve number of cusps is indeterminant. There is moderate calcification of the aortic valve. There is moderate thickening of the aortic valve. Aortic valve regurgitation is moderate. Moderate to severe aortic valve stenosis. Aortic regurgitation PHT measures 442 to 546 msec. Aortic valve area, by VTI measures 0.81 cm. Aortic valve mean gradient measures 34.0 mmHg. Aortic valve Vmax measures 4.17 m/s. DVI 0.40. AVA is underestimated due to small LVOT diameter. Vmax increased due to  increaesd flow from significant AI.  7. The inferior vena cava is normal in size with <50% respiratory variability, suggesting right atrial pressure of 8 mmHg. Conclusion(s)/Recommendation(s): No intracardiac source of embolism detected on this transthoracic study. Consider a transesophageal echocardiogram to exclude cardiac source of embolism if clinically indicated. FINDINGS  Left Ventricle: Left ventricular ejection fraction, by estimation, is 60 to 65%. The left ventricle has normal function. The left ventricle has no regional wall motion abnormalities. The left ventricular internal cavity size was normal in size. There is  mild left ventricular hypertrophy of the basal-septal segment. Left ventricular diastolic parameters are indeterminate. Elevated left ventricular end-diastolic pressure. Right Ventricle: The right ventricular size is mildly enlarged. No increase in right ventricular wall thickness. Right ventricular systolic function is normal. There is mildly elevated pulmonary artery systolic pressure. The tricuspid regurgitant velocity is 2.66 m/s, and with an assumed right atrial pressure of 8 mmHg, the estimated right ventricular systolic pressure is 36.3 mmHg. Left Atrium: Left atrial size was normal in size. Right Atrium: Right atrial size was mildly dilated. Pericardium: There is no evidence of  pericardial effusion. Mitral Valve: The mitral valve is normal in structure. Trivial mitral valve regurgitation. No evidence of mitral valve stenosis. MV peak gradient, 2.1 mmHg. The mean mitral valve gradient is 1.0 mmHg. Tricuspid Valve: The tricuspid valve is normal in structure. Tricuspid valve regurgitation is moderate . No evidence of tricuspid stenosis. Aortic Valve: AVA is underestimated due to small LVOT diameter. Vmax increased due to increaesd flow from significant AI. The aortic valve has an indeterminant number of cusps. There is moderate calcification of the  aortic valve. There is moderate thickening of the aortic valve. Aortic valve regurgitation is moderate. Aortic regurgitation PHT measures 546 msec. Moderate aortic stenosis is present. Aortic valve mean gradient measures 34.0 mmHg. Aortic valve peak gradient measures 69.6 mmHg. Aortic valve area, by VTI measures 0.81 cm. Pulmonic Valve: The pulmonic valve was normal in structure. Pulmonic valve regurgitation is mild. No evidence of pulmonic stenosis. Aorta: The aortic root is normal in size and structure. Venous: The inferior vena cava is normal in size with less than 50% respiratory variability, suggesting right atrial pressure of 8 mmHg. IAS/Shunts: No atrial level shunt detected by color flow Doppler.  LEFT VENTRICLE PLAX 2D LVIDd:         4.10 cm     Diastology LVIDs:         2.70 cm     LV e' medial:    3.71 cm/s LV PW:         0.90 cm     LV E/e' medial:  20.6 LV IVS:        1.20 cm     LV e' lateral:   8.90 cm/s LVOT diam:     1.50 cm     LV E/e' lateral: 8.6 LV SV:         64 LV SV Index:   39 LVOT Area:     1.77 cm  LV Volumes (MOD) LV vol d, MOD A2C: 93.5 ml LV vol d, MOD A4C: 81.3 ml LV vol s, MOD A2C: 27.7 ml LV vol s, MOD A4C: 30.6 ml LV SV MOD A2C:     65.8 ml LV SV MOD A4C:     81.3 ml LV SV MOD BP:      61.2 ml RIGHT VENTRICLE             IVC RV Basal diam:  4.20 cm     IVC diam: 2.30 cm RV S prime:     12.10 cm/s TAPSE (M-mode):  1.9 cm LEFT ATRIUM             Index        RIGHT ATRIUM           Index LA diam:        3.30 cm 1.97 cm/m   RA Area:     19.30 cm LA Vol (A2C):   29.4 ml 17.59 ml/m  RA Volume:   54.40 ml  32.56 ml/m LA Vol (A4C):   40.6 ml 24.30 ml/m LA Biplane Vol: 34.5 ml 20.65 ml/m  AORTIC VALVE AV Area (Vmax):    0.67 cm AV Area (Vmean):   0.71 cm AV Area (VTI):     0.81 cm AV Vmax:           417.00 cm/s AV Vmean:          269.000 cm/s AV VTI:            0.792 m AV Peak Grad:      69.6 mmHg AV Mean Grad:      34.0 mmHg LVOT Vmax:         158.50 cm/s LVOT Vmean:        108.000 cm/s LVOT VTI:          0.364 m LVOT/AV VTI ratio: 0.46 AI PHT:            546 msec AR Vena Contracta: 0.40 cm  AORTA Ao Root diam: 3.40 cm MITRAL VALVE  TRICUSPID VALVE MV Area (PHT): 3.17 cm    TR Peak grad:   28.3 mmHg MV Area VTI:   2.51 cm    TR Mean grad:   16.0 mmHg MV Peak grad:  2.1 mmHg    TR Vmax:        266.00 cm/s MV Mean grad:  1.0 mmHg    TR Vmean:       191.0 cm/s MV Vmax:       0.72 m/s MV Vmean:      42.0 cm/s   SHUNTS MV Decel Time: 239 msec    Systemic VTI:  0.36 m MV E velocity: 76.60 cm/s  Systemic Diam: 1.50 cm MV A velocity: 70.10 cm/s MV E/A ratio:  1.09 Armanda Magic MD Electronically signed by Armanda Magic MD Signature Date/Time: 11/24/2022/2:02:43 PM    Final    MR BRAIN WO CONTRAST  Result Date: 11/23/2022 CLINICAL DATA:  Transient ischemic attack (TIA) Transient right eye vision loss. Neurology recommends MRI, ESR/CRP and admit to [facility]. EXAM: MRI HEAD WITHOUT CONTRAST TECHNIQUE: Multiplanar, multiecho pulse sequences of the brain and surrounding structures were obtained without intravenous contrast. COMPARISON:  CT November 22, 2022. FINDINGS: Brain: No acute infarction, hemorrhage, hydrocephalus, extra-axial collection or mass lesion. Very mild for age scattered T2/FLAIR hyperintensities in the white matter, nonspecific but compatible with mild chronic microvascular ischemic change. Vascular:  Major arterial flow voids are maintained at the skull base. Skull and upper cervical spine: Normal marrow signal. Sinuses/Orbits: Mild paranasal sinus mucosal thickening. No acute orbital findings. Other: No mastoid effusions. IMPRESSION: No evidence of acute abnormality. Electronically Signed   By: Feliberto Harts M.D.   On: 11/23/2022 16:45     LOS: 1 day   Jeoffrey Massed, MD  [facility]    To contact the attending provider between 7A-7P or the covering provider during after hours 7P-7A, please log into the web site [email or web address] and access using universal [place] password for that web site. If you do not have the password, please call the hospital operator.  11/25/2022, 10:28 AM

## 2022-11-25 NOTE — Progress Notes (Signed)
   Pt is scheduled with anesthesia for cerebral arteriogram with innominate artery angioplasty/stent For 4/26 in IR  He and wife are aware and have signed consent  See new orders

## 2022-11-25 NOTE — Progress Notes (Addendum)
   Pt is scheduled for Cerebral arteriogram today in IR  Has developed symptoms of Rt eye vision changes since yesterday around noon. Similar symptoms as to when he was admittied  Denies pain Denies numbness/tingling Denies headache Denies speech changes  Dr Corliss Skains has seen and examined pt  Plan:  Plavix 300 mg now  Possible arteriogram with urgent intervention today  Dr Corliss Skains discussed with Dr Pearlean Brownie He will order IV Heparin and reevaluate pt   Let IR know needs at that time  I discussed this with pt and wife at bedside They have good understanding of plan

## 2022-11-25 NOTE — Code Documentation (Addendum)
Stroke Response Nurse Documentation Code Documentation  Steve Mcgrath is a 89 y.o. male admitted to [facility]  on 11/22/22 for transient right eye vision loss with past medical hx of CAD, DM, hypothyroid, MI. On aspirin 81 mg daily. Code stroke was activated by Neuro NP/MD.   Patient on 5W where right eye blurry vision persisting since yesterday afternoon. Patient originally going for angio 4/24 though schedule was unable to accommodate.   Stroke team at the bedside after patient activation. Patient to CT with team. NIHSS 0, see documentation for details and code stroke times. The following imaging was completed:  CT Head. Patient is not a candidate for IV Thrombolytic due to out of window. Patient is not a candidate for IR due to no LVO.   Bedside handoff with RN Thayer Ohm. Q2h NIHSS and vitals. Start hep gtt per order. Plan for patient to go to IR for angio today.    Scarlette Slice K  Rapid Response RN

## 2022-11-25 NOTE — Progress Notes (Signed)
STROKE TEAM PROGRESS NOTE   INTERVAL HISTORY His wife is at the bedside. He is sitting up in bed in no apparent distress.  Patient states he has been having some intermittent blurred vision in the right eye since yesterday.  This began this morning and we were notified.  I ordered a stat CT scan if negative for bleed start IV heparin.  Unfortunately this took several hours due to misunderstanding on the part of the interventional radiology PA who canceled the CT thinking that patient was going down for an angio instead which was being postponed.  Patient is now scheduled for angio tomorrow.  A code stroke was called by me in order to facilitate stat CT head as previously ordered CT: Inadvertently canceled.  CT head this morning shows no acute abnormality.  IV heparin has been started in the subjectively feels with blood reviewed and slightly improved.  Case discussed with Dr. Corliss Skains neurointerventional radiologist who agreed plan. Vitals:   11/25/22 1030 11/25/22 1050 11/25/22 1224 11/25/22 1240  BP: (!) 140/62 (!) 159/60 (!) 151/57 (!) 151/57  Pulse: 64 65 67 62  Resp: 11 (!) Temp:   97.7 F (36.5 C) 97.7 F (36.5 C)  TempSrc:   Oral   SpO2: 93% 92% 92% 93%  Weight:      Height:       CBC:  Recent Labs  Lab 11/22/22 1601  WBC 6.6  NEUTROABS 3.9  HGB 11.8*  HCT 35.2*  MCV 90.0  PLT 175   Basic Metabolic Panel:  Recent Labs  Lab 11/22/22 1601 11/24/22 0321  NA 137 137  K 4.2 4.0  CL 106 104  CO2 23 22  GLUCOSE 129* 99  BUN 38* 27*  CREATININE 1.36* 1.42*  CALCIUM 8.9 9.2   Lipid Panel:  Recent Labs  Lab 11/24/22 0321  CHOL 166  TRIG 75  HDL 49  CHOLHDL 3.4  VLDL 15  LDLCALC 161*   HgbA1c:  Recent Labs  Lab 11/24/22 0321  HGBA1C 6.0*   Urine Drug Screen: No results for input(s): "LABOPIA", "COCAINSCRNUR", "LABBENZ", "AMPHETMU", "THCU", "LABBARB" in the last 168 hours.  Alcohol Level  Recent Labs  Lab 11/22/22 1713  ETH <10    IMAGING past  24 hours CT HEAD CODE STROKE WO CONTRAST`  Result Date: 11/25/2022 CLINICAL DATA:  Code stroke.  Neuro deficit, acute, stroke suspected EXAM: CT HEAD WITHOUT CONTRAST TECHNIQUE: Contiguous axial images were obtained from the base of the skull through the vertex without intravenous contrast. RADIATION DOSE REDUCTION: This exam was performed according to the departmental dose-optimization program which includes automated exposure control, adjustment of the mA and/or kV according to patient size and/or use of iterative reconstruction technique. COMPARISON:  CT head 04/14/2021. FINDINGS: Brain: No evidence of acute large vascular territory infarction, hemorrhage, hydrocephalus, extra-axial collection or mass lesion/mass effect. Vascular: No hyperdense vessel identified. Skull: No acute fracture. Sinuses/Orbits: Right maxillary sinus mucosal thickening. Other: No mastoid effusions. ASPECTS [facility] Stroke Program Early CT Score) total score (0-10 with 10 being normal): 10. IMPRESSION: 1. No evidence of acute intracranial abnormality. 2. ASPECTS is 10. Code stroke imaging results were communicated on 11/25/2022 at 10:25 am to provider Dr. Pearlean Brownie via secure text paging. Electronically Signed   By: Feliberto Harts M.D.   On: 11/25/2022 10:26    PHYSICAL EXAM  Temp:  [97.2 F (36.2 C)-97.7 F (36.5 C)] 97.7 F (36.5 C) (04/25 1240) Pulse Rate:  [62-70] 62 (04/25 1240) Resp:  [  11-23] 16 (04/25 1240) BP: (125-159)/(42-66) 151/57 (04/25 1240) SpO2:  [92 %-94 %] 93 % (04/25 1240)  General - Well nourished, well developed, in no apparent distress. Cardiovascular - Regular rhythm and rate.  Harsh ejection systolic murmur heard throughout precordium and in the neck  Mental Status -  Level of arousal and orientation to time, place, and person were intact. Language including expression, naming, repetition, comprehension was assessed and found intact. Attention span and concentration were normal. Recent and  remote memory were intact. Fund of Knowledge was assessed and was intact.  Cranial Nerves II - XII - II - Visual field intact OU.  Subjective blurred vision in the right eye in the lower portion but able to count fingers quite well. III, IV, VI - Extraocular movements intact. V - Facial sensation intact bilaterally. VII - Facial movement intact bilaterally. VIII - Hearing & vestibular intact bilaterally. X - Palate elevates symmetrically. XI - Chin turning & shoulder shrug intact bilaterally. XII - Tongue protrusion intact.  Motor Strength - The patient's strength was normal in all extremities and pronator drift was absent.  Bulk was normal and fasciculations were absent.   Motor Tone - Muscle tone was assessed at the neck and appendages and was normal.  Sensory - Light touch, temperature/pinprick were assessed and were symmetrical.    Coordination - The patient had normal movements in the hands and feet with no ataxia or dysmetria.  Tremor was absent.  Gait and Station - deferred.  ASSESSMENT/PLAN Mr. GENTLE HOGE is a 89 y.o. male with history of ignificant of CAD with CABG, IIDM, CKD stage II, hypothyroidism, presented with transient vision loss.     amaurosis fugax   right eye likely due to symptomatic brachiocephalic origin stenosis Etiology: Large vessel disease  CT head No acute abnormality.  CTA head & neck -no LVO.  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  Right atria mildly dilated  LDL 102 HgbA1c 6.0 on 11/10/2022 VTE prophylaxis -heparin subcu    Diet   Diet Heart Room service appropriate? Yes; Fluid consistency: Thin   Diet NPO time specified   Aspirin 81 mg daily prior to admission, now on aspirin 81 mg daily and Plavix 75 mg daily  for 3 months.  Possibly may be longer depending on cerebral angiogram results tomorrow Therapy recommendations: Pending Disposition: Pending  Hypertension Home meds: Norvasc 10 mg, HCTZ 12.5 mg Stable Permissive hypertension (OK if < 220/120) but gradually normalize in 5-7 days Long-term BP goal normotensive  Hyperlipidemia Home meds: Atorvastatin 40, resumed in hospital LDL 102, goal < 70 Increased atorvastatin to 80 mg Continue statin at discharge   Other Stroke Risk Factors Advanced Age >/= 46  Coronary artery disease   Other Active Problems Hypothyroidism   He continues to have intermittent blurred vision in the right eye likely from high-grade right brachiocephalic artery origin stenosis which is likely symptomatic.  Remains at significant risk for recurrent strokes and vision loss.  Recommend start on IV heparin till angiogram and elective angioplasty stenting in addition to dual antiplatelet therapy aspirin and Plavix for at least 3 months and longer  if stent is done.  Check diagnostic catheter angiogram tomorrow to confirm stenosis recommend angioplasty stenting if stenosis is confirmed.  Aggressive risk factor modification.  Long discussion  with patient and wife at the bedside and answered questions.  Discussed with Dr. Corliss Skains interventional neuroradiologist and angiogram scheduled for tomorrow.  Discussed with Dr.Ghimire. Greater than 50% time during this 50-minute visit was spent on counseling and coordination of care about his vision loss and brachiocephalic artery stenosis and discussion about evaluation and treatment and answering questions.  Discussed with Dr. Lennox Laity, MD Medical Director [facility] Pager: [phone] 11/25/2022 3:12 PM  To contact Stroke Continuity provider, please refer to [email or web address]. After hours, contact General Neurology

## 2022-11-26 ENCOUNTER — Other Ambulatory Visit (HOSPITAL_COMMUNITY): Payer: Self-pay

## 2022-11-26 ENCOUNTER — Inpatient Hospital Stay (HOSPITAL_COMMUNITY): Payer: Medicare Other

## 2022-11-26 ENCOUNTER — Inpatient Hospital Stay (HOSPITAL_COMMUNITY): Payer: Medicare Other | Admitting: Certified Registered Nurse Anesthetist

## 2022-11-26 ENCOUNTER — Encounter (HOSPITAL_COMMUNITY): Payer: Self-pay | Admitting: Internal Medicine

## 2022-11-26 ENCOUNTER — Encounter (HOSPITAL_COMMUNITY)
Admission: EM | Disposition: A | Discharge status: Home Health | Payer: Self-pay | Source: Home / Self Care | Attending: Internal Medicine

## 2022-11-26 DIAGNOSIS — G459 Transient cerebral ischemic attack, unspecified: Secondary | ICD-10-CM | POA: Diagnosis not present

## 2022-11-26 DIAGNOSIS — I2510 Atherosclerotic heart disease of native coronary artery without angina pectoris: Secondary | ICD-10-CM

## 2022-11-26 DIAGNOSIS — R578 Other shock: Secondary | ICD-10-CM

## 2022-11-26 DIAGNOSIS — D62 Acute posthemorrhagic anemia: Secondary | ICD-10-CM | POA: Diagnosis not present

## 2022-11-26 DIAGNOSIS — I771 Stricture of artery: Secondary | ICD-10-CM | POA: Diagnosis not present

## 2022-11-26 DIAGNOSIS — I708 Atherosclerosis of other arteries: Secondary | ICD-10-CM | POA: Diagnosis present

## 2022-11-26 DIAGNOSIS — I6521 Occlusion and stenosis of right carotid artery: Secondary | ICD-10-CM

## 2022-11-26 DIAGNOSIS — E039 Hypothyroidism, unspecified: Secondary | ICD-10-CM | POA: Diagnosis not present

## 2022-11-26 DIAGNOSIS — Z87891 Personal history of nicotine dependence: Secondary | ICD-10-CM

## 2022-11-26 DIAGNOSIS — I10 Essential (primary) hypertension: Secondary | ICD-10-CM | POA: Diagnosis not present

## 2022-11-26 DIAGNOSIS — I252 Old myocardial infarction: Secondary | ICD-10-CM

## 2022-11-26 HISTORY — PX: IR ANGIOGRAM EXTREMITY RIGHT: IMG652

## 2022-11-26 HISTORY — PX: IR CT HEAD LTD: IMG2386

## 2022-11-26 HISTORY — PX: IR STENT PLACEMENT ANTE CAROTID INC ANGIO: IMG5522

## 2022-11-26 HISTORY — PX: RADIOLOGY WITH ANESTHESIA: SHX6223

## 2022-11-26 LAB — BASIC METABOLIC PANEL
Anion gap: 8 (ref 5–15)
Anion gap: 7 (ref 5–15)
BUN: 33 mg/dL — ABNORMAL HIGH (ref 8–23)
BUN: 32 mg/dL — ABNORMAL HIGH (ref 8–23)
CO2: 22 mmol/L (ref 22–32)
CO2: 18 mmol/L — ABNORMAL LOW (ref 22–32)
Calcium: 8.7 mg/dL — ABNORMAL LOW (ref 8.9–10.3)
Calcium: 7.6 mg/dL — ABNORMAL LOW (ref 8.9–10.3)
Chloride: 107 mmol/L (ref 98–111)
Chloride: 112 mmol/L — ABNORMAL HIGH (ref 98–111)
Creatinine, Ser: 1.48 mg/dL — ABNORMAL HIGH (ref 0.61–1.24)
Creatinine, Ser: 1.33 mg/dL — ABNORMAL HIGH (ref 0.61–1.24)
GFR, Estimated: 44 mL/min — ABNORMAL LOW (ref >60)
GFR, Estimated: 50 mL/min — ABNORMAL LOW (ref >60)
Glucose, Bld: 96 mg/dL (ref 70–99)
Glucose, Bld: 160 mg/dL — ABNORMAL HIGH (ref 70–99)
Potassium: 4.2 mmol/L (ref 3.5–5.1)
Potassium: 3.7 mmol/L (ref 3.5–5.1)
Sodium: 137 mmol/L (ref 135–145)
Sodium: 137 mmol/L (ref 135–145)

## 2022-11-26 LAB — CBC
HCT: 36 % — ABNORMAL LOW (ref 39.0–52.0)
HCT: 31 % — ABNORMAL LOW (ref 39.0–52.0)
Hemoglobin: 12.2 g/dL — ABNORMAL LOW (ref 13.0–17.0)
Hemoglobin: 10.1 g/dL — ABNORMAL LOW (ref 13.0–17.0)
MCH: 30.3 pg (ref 26.0–34.0)
MCH: 30 pg (ref 26.0–34.0)
MCHC: 33.9 g/dL (ref 30.0–36.0)
MCHC: 32.6 g/dL (ref 30.0–36.0)
MCV: 89.3 fL (ref 80.0–100.0)
MCV: 92 fL (ref 80.0–100.0)
Platelets: 173 10*3/uL (ref 150–400)
Platelets: 194 10*3/uL (ref 150–400)
RBC: 4.03 MIL/uL — ABNORMAL LOW (ref 4.22–5.81)
RBC: 3.37 MIL/uL — ABNORMAL LOW (ref 4.22–5.81)
RDW: 13.5 % (ref 11.5–15.5)
RDW: 13.3 % (ref 11.5–15.5)
WBC: 6.2 10*3/uL (ref 4.0–10.5)
WBC: 10.7 10*3/uL — ABNORMAL HIGH (ref 4.0–10.5)
nRBC: 0 % (ref 0.0–0.2)
nRBC: 0 % (ref 0.0–0.2)

## 2022-11-26 LAB — TYPE AND SCREEN: ABO/RH(D): A NEG

## 2022-11-26 LAB — BPAM RBC
ISSUE DATE / TIME: 202404262315
Unit Type and Rh: 600

## 2022-11-26 LAB — GLUCOSE, CAPILLARY
Glucose-Capillary: 104 mg/dL — ABNORMAL HIGH (ref 70–99)
Glucose-Capillary: 98 mg/dL (ref 70–99)
Glucose-Capillary: 139 mg/dL — ABNORMAL HIGH (ref 70–99)

## 2022-11-26 LAB — PREPARE RBC (CROSSMATCH)

## 2022-11-26 LAB — ABO/RH: ABO/RH(D): A NEG

## 2022-11-26 LAB — HEPARIN LEVEL (UNFRACTIONATED): Heparin Unfractionated: 0.3 IU/mL (ref 0.30–0.70)

## 2022-11-26 SURGERY — IR WITH ANESTHESIA
Anesthesia: General

## 2022-11-26 MED ORDER — ACETAMINOPHEN 160 MG/5ML PO SOLN: 325.0000 mg | ORAL | Status: DC | PRN

## 2022-11-26 MED ORDER — NITROGLYCERIN 1 MG/10 ML FOR IR/CATH LAB
INTRA_ARTERIAL | Status: AC
  Filled 2022-11-26: qty 10

## 2022-11-26 MED ORDER — ASPIRIN 81 MG PO CHEW
81.0000 mg | CHEWABLE_TABLET | Freq: Every day | ORAL | Status: DC
  Administered 2022-11-27 – 2022-11-29 (×3): 81 mg via ORAL
  Filled 2022-11-26 (×3): qty 1

## 2022-11-26 MED ORDER — ROCURONIUM BROMIDE 10 MG/ML (PF) SYRINGE
PREFILLED_SYRINGE | INTRAVENOUS | Status: DC | PRN
  Administered 2022-11-26: 50 mg via INTRAVENOUS
  Administered 2022-11-26 (×2): 20 mg via INTRAVENOUS
  Administered 2022-11-26: 10 mg via INTRAVENOUS

## 2022-11-26 MED ORDER — PHENYLEPHRINE HCL-NACL 20-0.9 MG/250ML-% IV SOLN
INTRAVENOUS | Status: DC | PRN
  Administered 2022-11-26: 25 ug/min via INTRAVENOUS

## 2022-11-26 MED ORDER — ASPIRIN 81 MG PO CHEW: 81.0000 mg | CHEWABLE_TABLET | Freq: Every day | ORAL | Status: DC

## 2022-11-26 MED ORDER — SODIUM CHLORIDE 0.9% IV SOLUTION: Freq: Once | INTRAVENOUS | Status: AC

## 2022-11-26 MED ORDER — LIDOCAINE 2% (20 MG/ML) 5 ML SYRINGE
INTRAMUSCULAR | Status: DC | PRN
  Administered 2022-11-26: 100 mg via INTRAVENOUS

## 2022-11-26 MED ORDER — ACETAMINOPHEN 325 MG PO TABS: 650.0000 mg | ORAL_TABLET | ORAL | Status: DC | PRN

## 2022-11-26 MED ORDER — SODIUM CHLORIDE 0.9 % IV SOLN: INTRAVENOUS | Status: DC | PRN

## 2022-11-26 MED ORDER — CLEVIDIPINE BUTYRATE 0.5 MG/ML IV EMUL
0.0000 mg/h | INTRAVENOUS | Status: DC
  Administered 2022-11-26 (×2): 8 mg/h via INTRAVENOUS
  Administered 2022-11-26: 10 mg/h via INTRAVENOUS
  Administered 2022-11-27 (×2): 6 mg/h via INTRAVENOUS
  Filled 2022-11-26 (×4): qty 50

## 2022-11-26 MED ORDER — CLOPIDOGREL BISULFATE 75 MG PO TABS
75.0000 mg | ORAL_TABLET | Freq: Every day | ORAL | Status: DC
  Administered 2022-11-27 – 2022-11-29 (×3): 75 mg via ORAL
  Filled 2022-11-26 (×3): qty 1

## 2022-11-26 MED ORDER — CLEVIDIPINE BUTYRATE 0.5 MG/ML IV EMUL
INTRAVENOUS | Status: DC | PRN
  Administered 2022-11-26: 2 mg/h via INTRAVENOUS

## 2022-11-26 MED ORDER — CLOPIDOGREL BISULFATE 75 MG PO TABS: 75.0000 mg | ORAL_TABLET | Freq: Every day | ORAL | Status: DC

## 2022-11-26 MED ORDER — EPHEDRINE SULFATE-NACL 50-0.9 MG/10ML-% IV SOSY
PREFILLED_SYRINGE | INTRAVENOUS | Status: DC | PRN
  Administered 2022-11-26: 2.5 mg via INTRAVENOUS

## 2022-11-26 MED ORDER — ATROPINE SULFATE 1 MG/ML IV SOLN
INTRAVENOUS | Status: AC
  Filled 2022-11-26: qty 1

## 2022-11-26 MED ORDER — NOREPINEPHRINE 4 MG/250ML-% IV SOLN: 0.0000 ug/min | INTRAVENOUS | Status: DC

## 2022-11-26 MED ORDER — FENTANYL CITRATE (PF) 250 MCG/5ML IJ SOLN
INTRAMUSCULAR | Status: DC | PRN
  Administered 2022-11-26: 50 ug via INTRAVENOUS

## 2022-11-26 MED ORDER — OXYCODONE HCL 5 MG/5ML PO SOLN: 5.0000 mg | Freq: Once | ORAL | Status: DC | PRN

## 2022-11-26 MED ORDER — EPTIFIBATIDE 20 MG/10ML IV SOLN
INTRAVENOUS | Status: AC
  Filled 2022-11-26: qty 10

## 2022-11-26 MED ORDER — HEPARIN (PORCINE) 25000 UT/250ML-% IV SOLN
650.0000 [IU]/h | INTRAVENOUS | Status: DC
  Administered 2022-11-26: 650 [IU]/h via INTRAVENOUS

## 2022-11-26 MED ORDER — CALCIUM GLUCONATE-NACL 2-0.675 GM/100ML-% IV SOLN
2.0000 g | Freq: Once | INTRAVENOUS | Status: AC
  Administered 2022-11-27: 2000 mg via INTRAVENOUS
  Filled 2022-11-26: qty 100

## 2022-11-26 MED ORDER — NOREPINEPHRINE 4 MG/250ML-% IV SOLN
INTRAVENOUS | Status: AC
  Filled 2022-11-26: qty 250

## 2022-11-26 MED ORDER — SUGAMMADEX SODIUM 200 MG/2ML IV SOLN
INTRAVENOUS | Status: DC | PRN
  Administered 2022-11-26: 200 mg via INTRAVENOUS

## 2022-11-26 MED ORDER — ORAL CARE MOUTH RINSE: 15.0000 mL | Freq: Once | OROMUCOSAL | Status: AC

## 2022-11-26 MED ORDER — FENTANYL CITRATE (PF) 100 MCG/2ML IJ SOLN
INTRAMUSCULAR | Status: AC
  Filled 2022-11-26: qty 2

## 2022-11-26 MED ORDER — ACETAMINOPHEN 650 MG RE SUPP: 650.0000 mg | RECTAL | Status: DC | PRN

## 2022-11-26 MED ORDER — OXYCODONE HCL 5 MG PO TABS: 5.0000 mg | ORAL_TABLET | Freq: Once | ORAL | Status: DC | PRN

## 2022-11-26 MED ORDER — PROPOFOL 500 MG/50ML IV EMUL
INTRAVENOUS | Status: DC | PRN
  Administered 2022-11-26: 25 ug/kg/min via INTRAVENOUS

## 2022-11-26 MED ORDER — ACETAMINOPHEN 10 MG/ML IV SOLN: 1000.0000 mg | Freq: Once | INTRAVENOUS | Status: DC | PRN

## 2022-11-26 MED ORDER — FENTANYL CITRATE (PF) 100 MCG/2ML IJ SOLN: 25.0000 ug | INTRAMUSCULAR | Status: DC | PRN

## 2022-11-26 MED ORDER — ACETAMINOPHEN 160 MG/5ML PO SOLN: 650.0000 mg | ORAL | Status: DC | PRN

## 2022-11-26 MED ORDER — AMISULPRIDE (ANTIEMETIC) 5 MG/2ML IV SOLN: 10.0000 mg | Freq: Once | INTRAVENOUS | Status: DC | PRN

## 2022-11-26 MED ORDER — CLEVIDIPINE BUTYRATE 0.5 MG/ML IV EMUL
INTRAVENOUS | Status: AC
  Filled 2022-11-26: qty 50

## 2022-11-26 MED ORDER — CHLORHEXIDINE GLUCONATE CLOTH 2 % EX PADS
6.0000 | MEDICATED_PAD | Freq: Every day | CUTANEOUS | Status: DC
  Administered 2022-11-26 – 2022-11-28 (×3): 6 via TOPICAL

## 2022-11-26 MED ORDER — HEPARIN (PORCINE) 25000 UT/250ML-% IV SOLN
500.0000 [IU]/h | INTRAVENOUS | Status: DC
Start: 2022-11-26 — End: 2022-11-26
  Administered 2022-11-26: 500 [IU]/h via INTRAVENOUS

## 2022-11-26 MED ORDER — NOREPINEPHRINE 4 MG/250ML-% IV SOLN
2.0000 ug/min | INTRAVENOUS | Status: DC
  Administered 2022-11-26: 5 ug/min via INTRAVENOUS

## 2022-11-26 MED ORDER — HEPARIN (PORCINE) 25000 UT/250ML-% IV SOLN
INTRAVENOUS | Status: AC
  Filled 2022-11-26: qty 250

## 2022-11-26 MED ORDER — PHENYLEPHRINE 80 MCG/ML (10ML) SYRINGE FOR IV PUSH (FOR BLOOD PRESSURE SUPPORT)
PREFILLED_SYRINGE | INTRAVENOUS | Status: DC | PRN
  Administered 2022-11-26 (×2): 80 ug via INTRAVENOUS

## 2022-11-26 MED ORDER — HEPARIN SODIUM (PORCINE) 1000 UNIT/ML IJ SOLN
INTRAMUSCULAR | Status: DC | PRN
  Administered 2022-11-26: 3000 [IU] via INTRAVENOUS

## 2022-11-26 MED ORDER — IOHEXOL 300 MG/ML  SOLN
150.0000 mL | Freq: Once | INTRAMUSCULAR | Status: AC | PRN
  Administered 2022-11-26: 100 mL via INTRA_ARTERIAL

## 2022-11-26 MED ORDER — CEFAZOLIN SODIUM-DEXTROSE 2-4 GM/100ML-% IV SOLN
INTRAVENOUS | Status: AC
  Filled 2022-11-26: qty 100

## 2022-11-26 MED ORDER — PROPOFOL 10 MG/ML IV BOLUS
INTRAVENOUS | Status: DC | PRN
  Administered 2022-11-26: 70 mg via INTRAVENOUS

## 2022-11-26 MED ORDER — SODIUM CHLORIDE 0.9 % IV SOLN: INTRAVENOUS | Status: DC

## 2022-11-26 MED ORDER — CEFAZOLIN SODIUM-DEXTROSE 2-4 GM/100ML-% IV SOLN
2.0000 g | INTRAVENOUS | Status: AC
  Administered 2022-11-26: 2 g via INTRAVENOUS
  Filled 2022-11-26: qty 100

## 2022-11-26 MED ORDER — ATROPINE SULFATE 1 MG/10ML IJ SOSY
PREFILLED_SYRINGE | INTRAMUSCULAR | Status: AC
  Filled 2022-11-26: qty 10

## 2022-11-26 MED ORDER — CHLORHEXIDINE GLUCONATE 0.12 % MT SOLN: 15.0000 mL | Freq: Once | OROMUCOSAL | Status: AC

## 2022-11-26 MED ORDER — PROMETHAZINE HCL 25 MG/ML IJ SOLN: 6.2500 mg | INTRAMUSCULAR | Status: DC | PRN

## 2022-11-26 MED ORDER — OXIDIZED CELLULOSE EX PADS
1.0000 | MEDICATED_PAD | Freq: Once | CUTANEOUS | Status: DC
  Filled 2022-11-26: qty 1

## 2022-11-26 MED ORDER — ACETAMINOPHEN 325 MG PO TABS: 325.0000 mg | ORAL_TABLET | ORAL | Status: DC | PRN

## 2022-11-26 MED ORDER — IOHEXOL 300 MG/ML  SOLN
100.0000 mL | Freq: Once | INTRAMUSCULAR | Status: AC | PRN
  Administered 2022-11-26: 68 mL via INTRA_ARTERIAL

## 2022-11-26 MED ORDER — DEXAMETHASONE SODIUM PHOSPHATE 10 MG/ML IJ SOLN
INTRAMUSCULAR | Status: DC | PRN
  Administered 2022-11-26: 10 mg via INTRAVENOUS

## 2022-11-26 MED ORDER — CHLORHEXIDINE GLUCONATE 0.12 % MT SOLN
OROMUCOSAL | Status: AC
  Administered 2022-11-26: 15 mL via OROMUCOSAL
  Filled 2022-11-26: qty 15

## 2022-11-26 MED ORDER — ONDANSETRON HCL 4 MG/2ML IJ SOLN
INTRAMUSCULAR | Status: DC | PRN
  Administered 2022-11-26: 4 mg via INTRAVENOUS

## 2022-11-26 MED ORDER — LIDOCAINE HCL 1 % IJ SOLN
INTRAMUSCULAR | Status: AC
  Filled 2022-11-26: qty 20

## 2022-11-26 MED ORDER — SODIUM CHLORIDE 0.9 % IV SOLN: 250.0000 mL | INTRAVENOUS | Status: DC

## 2022-11-26 MED ORDER — ACETAMINOPHEN 500 MG PO TABS
1000.0000 mg | ORAL_TABLET | Freq: Once | ORAL | Status: AC
  Administered 2022-11-26: 1000 mg via ORAL
  Filled 2022-11-26: qty 2

## 2022-11-26 NOTE — Progress Notes (Signed)
Pt arrived to 4N29 at this time. Pt is alert and orientedx4, vitals WNL, NIH 0, femoral site is clean, dry, intact at level 0.   Robina Ade, RN

## 2022-11-26 NOTE — Progress Notes (Signed)
Notified patient was bleeding from access site, hypotensive Started fluids,  Started levophed,  Ordered blood, consent obtained from patient and wife, appreciate CCM guidance for 2 units at this time Pausing heparin, discontinuing heparin going forward Antiplatelet plan per Dr. Corliss Skains, who was called and is at bedside Appreciate CCM co-management on coagulopathy labs and ongoing monitoring  Brooke Dare MD-PhD [facility] [phone] Available 7 PM to 7 AM, outside of these hours please call Neurologist on call as listed on Amion.  40 minutes of critical care time

## 2022-11-26 NOTE — Progress Notes (Signed)
Patient complaining of R eye vision changes. Patient and patient's daughter state that vision changes were his only symptoms prior to hospitalization. 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 Patient reports impaired vision, and seeing green and red spots or lights intermittently. Vital signs are WNL and neuro status is otherwise unchanged. Dr. Iver Nestle was updated on this.

## 2022-11-26 NOTE — Sedation Documentation (Signed)
Condom catheter placed on pt at request of MD.  Pt educated on placement prior to receiving anesthesia and verbalized understanding with no questions/concerns voiced.

## 2022-11-26 NOTE — Anesthesia Postprocedure Evaluation (Signed)
Anesthesia Post Note  Patient: Steve Mcgrath  Procedure(s) Performed: Cerebral angioplasty with stenting     Patient location during evaluation: PACU Anesthesia Type: General Level of consciousness: awake and alert Pain management: pain level controlled Vital Signs Assessment: post-procedure vital signs reviewed and stable Respiratory status: spontaneous breathing, nonlabored ventilation, respiratory function stable and patient connected to nasal cannula oxygen Cardiovascular status: blood pressure returned to baseline and stable Postop Assessment: no apparent nausea or vomiting Anesthetic complications: no  No notable events documented.  Last Vitals:  Vitals:   11/26/22 1700 11/26/22 1715  BP: 108/62 (!) 123/55  Pulse: 65 61  Resp:  11  Temp:  36.4 C  SpO2:  92%    Last Pain:  Vitals:   11/26/22 1715  TempSrc: Oral  PainSc:     LLE Motor Response: Purposeful movement (11/26/22 1715) LLE Sensation: Full sensation (11/26/22 1715) RLE Motor Response: Purposeful movement (11/26/22 1715) RLE Sensation: Full sensation (11/26/22 1715)      Shelton Silvas

## 2022-11-26 NOTE — H&P (Signed)
Patient Status: [facility] - In-pt  Assessment and Plan: TIA  Patient with brachiocephalic stenosis.  Plan made for cerebral angiogram with possible intervention with Dr. Corliss Skains.  Consult completed.  Patient and family have discussed with Dr. Corliss Skains. They are agreeable to intervention today if needed.  He has been NPO.  Heparin stopped at time of visit- 1145am.   Risks and benefits of cerebral angiogram with intervention were discussed with the patient including, but not limited to bleeding, infection, vascular injury, contrast induced renal failure, stroke or even death.  This interventional procedure involves the use of X-rays and because of the nature of the planned procedure, it is possible that we will have prolonged use of X-ray fluoroscopy.  Potential radiation risks to you include (but are not limited to) the following: - A slightly elevated risk for cancer  several years later in life. This risk is typically less than 0.5% percent. This risk is low in comparison to the normal incidence of human cancer, which is 33% for women and 50% for men according to the [employer]. - Radiation induced injury can include skin redness, resembling a rash, tissue breakdown / ulcers and hair loss (which can be temporary or permanent).   The likelihood of either of these occurring depends on the difficulty of the procedure and whether you are sensitive to radiation due to previous procedures, disease, or genetic conditions.   IF your procedure requires a prolonged use of radiation, you will be notified and given written instructions for further action.  It is your responsibility to monitor the irradiated area for the 2 weeks following the procedure and to notify your physician if you are concerned that you have suffered a radiation induced injury.    All of the patient's questions were answered, patient is agreeable to proceed.  Consent signed and in  chart.  ______________________________________________________________________   History of Present Illness: Steve Mcgrath is a 89 y.o. male with intermittent blurred vision in the right eye likely due to high-grade right brachiocephalic artery.  Plan made to proceed with diagnostic cerebral angiogram with possible intervention.   Allergies and medications reviewed.   Review of Systems: A 12 point ROS discussed and pertinent positives are indicated in the HPI above.  All other systems are negative.  Review of Systems  Constitutional:  Negative for fatigue and fever.  Eyes:  Positive for visual disturbance.  Respiratory:  Negative for cough and shortness of breath.   Cardiovascular:  Negative for chest pain.  Gastrointestinal:  Negative for abdominal pain, nausea and vomiting.  Musculoskeletal:  Negative for back pain.  Psychiatric/Behavioral:  Negative for behavioral problems and confusion.     Vital Signs: BP (!) 147/58 (BP Location: Left Arm)   Pulse 65   Temp 97.7 F (36.5 C) (Oral)   Resp 18   Ht 5\' 4"  (1.626 m)   Wt 138 lb (62.6 kg)   SpO2 94%   BMI 23.69 kg/m   Physical Exam Vitals and nursing note reviewed.  Constitutional:      General: He is not in acute distress.    Appearance: Normal appearance. He is not ill-appearing.  HENT:     Mouth/Throat:     Mouth: Mucous membranes are moist.     Pharynx: Oropharynx is clear.  Cardiovascular:     Rate and Rhythm: Normal rate and regular rhythm.  Pulmonary:     Effort: Pulmonary effort is normal.     Breath sounds: Normal breath sounds.  Abdominal:     General: Abdomen is flat. There is no distension.     Palpations: Abdomen is soft.  Skin:    General: Skin is warm and dry.  Neurological:     General: No focal deficit present.     Mental Status: He is alert and oriented to person, place, and time. Mental status is at baseline.  Psychiatric:        Mood and Affect: Mood normal.        Behavior: Behavior  normal.        Thought Content: Thought content normal.        Judgment: Judgment normal.      Imaging reviewed.   Labs:  COAGS: Recent Labs    11/22/22 1617  INR 1.1  APTT 32    BMP: Recent Labs    11/22/22 1601 11/24/22 0321 11/26/22 0547  NA 137 137 137  K 4.2 4.0 4.2  CL 106 104 107  CO2 23 22 22   GLUCOSE 129* 99 96  BUN 38* 27* 33*  CALCIUM 8.9 9.2 8.7*  CREATININE 1.36* 1.42* 1.48*  GFRNONAA 49* 46* 44*       Electronically Signed: Hoyt Koch, PA 11/26/2022, 11:49 AM   I spent a total of 15 minutes in face to face in clinical consultation, greater than 50% of which was counseling/coordinating care for TIA.

## 2022-11-26 NOTE — Progress Notes (Signed)
STROKE TEAM PROGRESS NOTE   INTERVAL HISTORY His wife is at the bedside. He is sitting up in bed in no apparent distress.  Patient states his vision loss is improved but still has some blurred vision in the lower portion in the right eye.  Is on heparin drip.  Neurological exam is stable.  Vital signs are stable.  He is scheduled for elective angiogram followed by possible angioplasty stenting later this morning. Vitals:   11/26/22 0100 11/26/22 0400 11/26/22 0445 11/26/22 0817  BP:   (!) 133/47 (!) 147/58  Pulse: 63 62    Resp: 19 19    Temp:   98.1 F (36.7 C) 97.9 F (36.6 C)  TempSrc:   Oral Oral  SpO2: 94% 93%    Weight:      Height:       CBC:  Recent Labs  Lab 11/22/22 1601 11/26/22 0547  WBC 6.6 6.2  NEUTROABS 3.9  --   HGB 11.8* 12.2*  HCT 35.2* 36.0*  MCV 90.0 89.3  PLT 175 173   Basic Metabolic Panel:  Recent Labs  Lab 11/24/22 0321 11/26/22 0547  NA 137 137  K 4.0 4.2  CL 104 107  CO2 22 22  GLUCOSE 99 96  BUN 27* 33*  CREATININE 1.42* 1.48*  CALCIUM 9.2 8.7*   Lipid Panel:  Recent Labs  Lab 11/24/22 0321  CHOL 166  TRIG 75  HDL 49  CHOLHDL 3.4  VLDL 15  LDLCALC 161*   HgbA1c:  Recent Labs  Lab 11/24/22 0321  HGBA1C 6.0*   Urine Drug Screen: No results for input(s): "LABOPIA", "COCAINSCRNUR", "LABBENZ", "AMPHETMU", "THCU", "LABBARB" in the last 168 hours.  Alcohol Level  Recent Labs  Lab 11/22/22 1713  ETH <10    IMAGING past 24 hours No results found.  PHYSICAL EXAM  Temp:  [97.7 F (36.5 C)-98.4 F (36.9 C)] 97.9 F (36.6 C) (04/26 0817) Pulse Rate:  [61-69] 62 (04/26 0400) Resp:  [16-20] 19 (04/26 0400) BP: (133-161)/(47-60) 147/58 (04/26 0817) SpO2:  [92 %-95 %] 93 % (04/26 0400)  General - Well nourished, well developed, in no apparent distress. Cardiovascular - Regular rhythm and rate.  Harsh ejection systolic murmur heard throughout precordium and in the neck  Mental Status -  Level of arousal and orientation to  time, place, and person were intact. Language including expression, naming, repetition, comprehension was assessed and found intact. Attention span and concentration were normal. Recent and remote memory were intact. Fund of Knowledge was assessed and was intact.  Cranial Nerves II - XII - II - Visual field intact OU.  Subjective blurred vision in the right eye in the lower portion but able to count fingers quite well. III, IV, VI - Extraocular movements intact. V - Facial sensation intact bilaterally. VII - Facial movement intact bilaterally. VIII - Hearing & vestibular intact bilaterally. X - Palate elevates symmetrically. XI - Chin turning & shoulder shrug intact bilaterally. XII - Tongue protrusion intact.  Motor Strength - The patient's strength was normal in all extremities and pronator drift was absent.  Bulk was normal and fasciculations were absent.   Motor Tone - Muscle tone was assessed at the neck and appendages and was normal.  Sensory - Light touch, temperature/pinprick were assessed and were symmetrical.    Coordination - The patient had normal movements in the hands and feet with no ataxia or dysmetria.  Tremor was absent.  Gait and Station - deferred.  ASSESSMENT/PLAN Mr. Steve  L Mcgrath is a 89 y.o. male with history of ignificant of CAD with CABG, IIDM, CKD stage II, hypothyroidism, presented with transient vision loss.     amaurosis fugax   right eye likely due to symptomatic brachiocephalic origin stenosis Etiology: Large vessel disease  CT head No acute abnormality.  CTA head & neck -no LVO.  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  Right atria mildly dilated  LDL 102 HgbA1c 6.0 on 11/10/2022 VTE prophylaxis -heparin subcu    Diet    Diet NPO time specified   Aspirin 81 mg daily prior to admission, now on aspirin 81 mg daily and Plavix 75 mg daily for 3 months.  Possibly may be longer depending on cerebral angiogram results tomorrow Therapy recommendations: Pending Disposition: Pending  Hypertension Home meds: Norvasc 10 mg, HCTZ 12.5 mg Stable Permissive hypertension (OK if < 220/120) but gradually normalize in 5-7 days Long-term BP goal normotensive  Hyperlipidemia Home meds: Atorvastatin 40, resumed in hospital LDL 102, goal < 70 Increased atorvastatin to 80 mg Continue statin at discharge   Other Stroke Risk Factors Advanced Age >/= 11  Coronary artery disease   Other Active Problems Hypothyroidism   He continues to have intermittent blurred vision in the right eye likely from high-grade right brachiocephalic artery origin stenosis which is likely symptomatic.  Remains at significant risk for recurrent strokes and vision loss.  Continue IV heparin till angiogram and elective angioplasty stenting in addition to dual antiplatelet therapy aspirin and Plavix for at least 3 months and longer  if stent is done.  Check diagnostic catheter angiogram today to confirm stenosis recommend angioplasty stenting if stenosis is confirmed.  Aggressive risk factor modification.  Long discussion with patient and wife at the bedside and answered questions.  Discussed with Dr. Corliss Skains interventional neuroradiologist and angiogram scheduled for tomorrow.  Discussed with Dr.Ghimire. Greater than 50% time during this 35-minute visit was spent on counseling and coordination of care about his vision loss and brachiocephalic artery stenosis and discussion about evaluation and treatment and answering questions.  Discussed with Dr. Lennox Laity, MD Medical Director [facility] Stroke Center Pager: [phone] 11/26/2022 11:14 AM  To contact Stroke Continuity provider, please refer to [email or web address]. After hours, contact  General Neurology

## 2022-11-26 NOTE — Progress Notes (Signed)
ANTICOAGULATION CONSULT NOTE - Initial Consult  Pharmacy Consult for heparin Indication: Post-IR  No Known Allergies  Patient Measurements: Height: 5\' 4"  (162.6 cm) Weight: 62.6 kg (138 lb) IBW/kg (Calculated) : 59.2 Heparin Dosing Weight: TBW  Vital Signs: Temp: 97.8 F (36.6 C) (04/26 1625) Temp Source: Oral (04/26 1142) BP: 112/48 (04/26 1625) Pulse Rate: 60 (04/26 1625)  Labs: Recent Labs    11/24/22 0321 11/25/22 1904 11/26/22 0547  HGB  --   --  12.2*  HCT  --   --  36.0*  PLT  --   --  173  HEPARINUNFRC  --  0.13* 0.30  CREATININE 1.42*  --  1.48*    Estimated Creatinine Clearance: 26.1 mL/min (A) (by C-G formula based on SCr of 1.48 mg/dL (H)).   Medical History: Past Medical History:  Diagnosis Date   CAD (coronary artery disease)    DM (diabetes mellitus) (HCC)    Enlarged prostate    Hypothyroid    Kidney stones    MI, acute, non ST segment elevation (HCC)    Murmur     Assessment: 41 YOM presenting with brachiocephalic stenosis, now s/p IR angioplasty on heparin 500 units/hr.  Pre-op on heparin gtt per stroke protocol  Goal of Therapy:  Heparin level 0.1-0.25 units/ml Monitor platelets by anticoagulation protocol: Yes   Plan:  Increase heparin gtt to 650 units/hr F/u heparin level with AM labs F/u [facility] plan with Stroke/IR teams in AM  Daylene Posey, PharmD, [facility] Clinical Pharmacist ED Pharmacist Phone # [phone] 11/26/2022 5:23 PM

## 2022-11-26 NOTE — Progress Notes (Signed)
Patient going to the or with family present. Family to take all of patient belongings with them.

## 2022-11-26 NOTE — Anesthesia Procedure Notes (Signed)
Procedure Name: Intubation Date/Time: 11/26/2022 12:46 PM  Performed by: Garfield Cornea, CRNAPre-anesthesia Checklist: Patient identified, Emergency Drugs available, Suction available and Patient being monitored Patient Re-evaluated:Patient Re-evaluated prior to induction Oxygen Delivery Method: Circle System Utilized Preoxygenation: Pre-oxygenation with 100% oxygen Induction Type: IV induction Ventilation: Mask ventilation without difficulty Laryngoscope Size: Mac and 4 Grade View: Grade I Tube type: Oral Tube size: 7.5 mm Number of attempts: 1 Airway Equipment and Method: Stylet Placement Confirmation: ETT inserted through vocal cords under direct vision, positive ETCO2 and breath sounds checked- equal and bilateral Secured at: 22 cm Tube secured with: Tape Dental Injury: Teeth and Oropharynx as per pre-operative assessment

## 2022-11-26 NOTE — Progress Notes (Signed)
ANTICOAGULATION CONSULT NOTE - Follow Up Consult  Pharmacy Consult for heparin Indication:  brachiocephalic artery origin stenosis  Labs: Recent Labs    11/24/22 0321 11/25/22 1904 11/26/22 0547  HGB  --   --  12.2*  HCT  --   --  36.0*  PLT  --   --  173  HEPARINUNFRC  --  0.13* 0.30  CREATININE 1.42*  --  1.48*    Assessment/Plan:  89yo male therapeutic on heparin after rate change; plan for cerebral arteriogram with innominate artery angioplasty/stent today. Will continue infusion at current rate of 850 units/hr and f/u after procedure.   Vernard Gambles, PharmD, BCPS  11/26/2022,6:58 AM

## 2022-11-26 NOTE — Sedation Documentation (Signed)
ACT = 201. Dr. Eustace Quail notified directly and acknowledges.

## 2022-11-26 NOTE — Sedation Documentation (Signed)
ACT= 206. Dr. Corliss Skains notified directly and acknowledges.

## 2022-11-26 NOTE — Progress Notes (Signed)
Pt c/o tingling/shakiness and feeling SOB and like "something is just not right." Pt's vitals all WNL, no change in neuro exam, fem site unchanged. CBG 139. Lung sounds clear, equal chest rise and fall. This RN text-paged Dr. Iver Nestle, who will round on pt to assess. This was passed on to nightshift RN, Darel Hong.   Robina Ade, RN

## 2022-11-26 NOTE — Progress Notes (Signed)
PROGRESS NOTE        PATIENT DETAILS Name: Steve Mcgrath Age: 89 y.o. Sex: male Date of Birth: 12-08-1926 Admit Date: 11/22/2022 Admitting Physician Dewayne Shorter Levora Dredge, MD [id number], Diana Eves., MD  Brief Summary: Patient is a 89 y.o.  male history of CAD s/p CABG, DM-2 who presented with transient right-sided visual loss-upon further evaluation with imaging studies-this was felt to be secondary to symptomatic high-grade right brachiocephalic artery stenosis.  See below for further details.    Significant events: 4/22>> admitted to [facility]-transient right-sided visual loss  4/25>> recurrent/waxing/waning visual loss right eye overnight-repeat CT head negative-loaded with Plavix-IV heparin being started-IR planning catheter angiogram/stent placement  Significant studies: 4/22>> CRP: 0.6 4/22>> ESR: 24 4/22>> CTA head/neck: No intracranial large vessel occlusion, apart from moderate stenosis of the right vertebral artery-no hemodynamically significant stenosis in the neck.  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 4/23>> MRI brain: No CVA 4/24>> LDL 102 4/24>> A1c: 6.0 4/24>> echo: EF 60-65%-moderate to severe aortic valve stenosis 4/25> repeat CT head: No acute intracranial abnormality.  Significant microbiology data: None  Procedures: None  Consults: Neurology Interventional radiology  Subjective: Seen earlier this morning-right eye vision is better than yesterday.  No other focal neurological deficits-no family at bedside.  Objective: Vitals: Blood pressure (!) 147/58, pulse 62, temperature 97.9 F (36.6 C), temperature source Oral, resp. rate 19, height 5\' 4"  (1.626 m), weight 62.6 kg, SpO2 93 %.   Exam: Gen Exam:Alert awake-not in any distress HEENT:atraumatic, normocephalic Chest: B/L clear to auscultation anteriorly CVS:S1S2 regular Abdomen:soft non tender, non distended Extremities:no edema Neurology: Non focal Skin:  no rash  Pertinent Labs/Radiology:    Latest Ref Rng & Units 11/26/2022    5:47 AM 11/22/2022    4:01 PM 04/14/2021    9:49 PM  CBC  WBC 4.0 - 10.5 K/uL 6.2  6.6  5.3   Hemoglobin 13.0 - 17.0 g/dL 95.6  38.7  56.4   Hematocrit 39.0 - 52.0 % 36.0  35.2  33.8   Platelets 150 - 400 K/uL 173  175  175     Lab Results  Component Value Date   NA 137 11/26/2022   K 4.2 11/26/2022   CL 107 11/26/2022   CO2 22 11/26/2022      Assessment/Plan: TIA-right amaurosis fugax-secondary to symptomatic right brachiocephalic artery origin stenosis Stable overnight-some minimal right eye blurring but much better than yesterday.  No other focal neurological deficits. On 4/25-patient developed a waxing/waning right eye visual loss-he was loaded with Plavix-started on IV heparin.  Was already on aspirin/statin IR following-with plans for catheter angiogram/stent placement today.   Stroke team following closely as well.  HTN BP stable Continue amlodipine HCTZ on hold due to potential for angiogram later today.  History of CAD s/p three-vessel CABG 2001 No anginal symptom On antiplatelet/statin  History of moderate aortic stenosis Repeat echo confirms moderate-severe aortic stenosis on echo-patient is followed by cardiology at [facility].  DM-2 Diet controlled at home Check CBGs periodically  Hypothyroidism Synthroid  GERD PPI  CKD stage IIIa At baseline Hold HCTZ-as angiogram planned  BMI: Estimated body mass index is 23.69 kg/m as calculated from the following:   Height as of this encounter: 5\' 4"  (1.626 m).   Weight as of this encounter: 62.6 kg.   Code status:  Code Status: Full Code   DVT Prophylaxis:    Family Communication: Spouse at bedside   Disposition Plan: Status is: Observation The patient will require care spanning > 2 midnights and should be moved to inpatient because: Severity of illness   Planned Discharge  Destination:Home   Diet: Diet Order             Diet NPO time specified  Diet effective midnight                     Antimicrobial agents: Anti-infectives (From admission, onward)    Start     Dose/Rate Route Frequency Ordered Stop   11/26/22 1100  ceFAZolin (ANCEF) IVPB 2g/100 mL premix        2 g 200 mL/hr over 30 Minutes Intravenous To Radiology 11/26/22 0716 11/27/22 1100        MEDICATIONS: Scheduled Meds:  amLODipine  10 mg Oral Daily   aspirin EC  81 mg Oral Daily   atorvastatin  80 mg Oral QHS   clopidogrel  75 mg Oral Daily   levothyroxine  100 mcg Oral Q0600   pantoprazole  40 mg Oral Daily   Continuous Infusions:  sodium chloride 50 mL/hr at 11/25/22 2049    ceFAZolin (ANCEF) IV     heparin 850 Units/hr (11/25/22 2048)   PRN Meds:.acetaminophen **OR** acetaminophen (TYLENOL) oral liquid 160 mg/5 mL **OR** acetaminophen, senna-docusate   I have personally reviewed following labs and imaging studies  LABORATORY DATA: CBC: Recent Labs  Lab 11/22/22 1601 11/26/22 0547  WBC 6.6 6.2  NEUTROABS 3.9  --   HGB 11.8* 12.2*  HCT 35.2* 36.0*  MCV 90.0 89.3  PLT 175 173     Basic Metabolic Panel: Recent Labs  Lab 11/22/22 1601 11/24/22 0321 11/26/22 0547  NA 137 137 137  K 4.2 4.0 4.2  CL 106 104 107  CO2 23 22 22   GLUCOSE 129* 99 96  BUN 38* 27* 33*  CREATININE 1.36* 1.42* 1.48*  CALCIUM 8.9 9.2 8.7*     GFR: Estimated Creatinine Clearance: 26.1 mL/min (A) (by C-G formula based on SCr of 1.48 mg/dL (H)).  Liver Function Tests: Recent Labs  Lab 11/22/22 1601  AST 18  ALT 14  ALKPHOS 64  BILITOT 0.4  PROT 7.0  ALBUMIN 3.9    No results for input(s): "LIPASE", "AMYLASE" in the last 168 hours. No results for input(s): "AMMONIA" in the last 168 hours.  Coagulation Profile: Recent Labs  Lab 11/22/22 1617  INR 1.1     Cardiac Enzymes: No results for input(s): "CKTOTAL", "CKMB", "CKMBINDEX", "TROPONINI" in the last 168  hours.  BNP (last 3 results) No results for input(s): "PROBNP" in the last 8760 hours.  Lipid Profile: Recent Labs    11/24/22 0321  CHOL 166  HDL 49  LDLCALC 102*  TRIG 75  CHOLHDL 3.4     Thyroid Function Tests: No results for input(s): "TSH", "T4TOTAL", "FREET4", "T3FREE", "THYROIDAB" in the last 72 hours.  Anemia Panel: No results for input(s): "VITAMINB12", "FOLATE", "FERRITIN", "TIBC", "IRON", "RETICCTPCT" in the last 72 hours.  Urine analysis: No results found for: "COLORURINE", "APPEARANCEUR", "LABSPEC", "PHURINE", "GLUCOSEU", "HGBUR", "BILIRUBINUR", "KETONESUR", "PROTEINUR", "UROBILINOGEN", "NITRITE", "LEUKOCYTESUR"  Sepsis Labs: Lactic Acid, Venous No results found for: "LATICACIDVEN"  MICROBIOLOGY: No results found for this or any previous visit (from the past 240 hour(s)).  RADIOLOGY STUDIES/RESULTS: CT HEAD CODE STROKE WO CONTRAST`  Result Date: 11/25/2022 CLINICAL DATA:  Code stroke.  Neuro  deficit, acute, stroke suspected EXAM: CT HEAD WITHOUT CONTRAST TECHNIQUE: Contiguous axial images were obtained from the base of the skull through the vertex without intravenous contrast. RADIATION DOSE REDUCTION: This exam was performed according to the departmental dose-optimization program which includes automated exposure control, adjustment of the mA and/or kV according to patient size and/or use of iterative reconstruction technique. COMPARISON:  CT head 04/14/2021. FINDINGS: Brain: No evidence of acute large vascular territory infarction, hemorrhage, hydrocephalus, extra-axial collection or mass lesion/mass effect. Vascular: No hyperdense vessel identified. Skull: No acute fracture. Sinuses/Orbits: Right maxillary sinus mucosal thickening. Other: No mastoid effusions. ASPECTS [facility] Stroke Program Early CT Score) total score (0-10 with 10 being normal): 10. IMPRESSION: 1. No evidence of acute intracranial abnormality. 2. ASPECTS is 10. Code stroke imaging results were  communicated on 11/25/2022 at 10:25 am to provider Dr. Pearlean Brownie via secure text paging. Electronically Signed   By: Feliberto Harts M.D.   On: 11/25/2022 10:26   ECHOCARDIOGRAM COMPLETE  Result Date: 11/24/2022    ECHOCARDIOGRAM REPORT   Patient Name:   SALVADOR COUPE Date of Exam: 11/24/2022 Medical Rec #:  [id number]     Height:       64.0 in Accession #:    [id number]    Weight:       138.0 lb Date of Birth:  11-12-1926    BSA:          1.671 m Patient Age:    89 years      BP:           128/45 mmHg Patient Gender: M             HR:           60 bpm. Exam Location:  Inpatient Procedure: 2D Echo, Color Doppler and Cardiac Doppler REPORT CONTAINS CRITICAL RESULT Indications:    Stroke  History:        Patient has no prior history of Echocardiogram examinations. CAD                 and Previous Myocardial Infarction, Prior CABG, TIA,                 Signs/Symptoms:Murmur; Risk Factors:Diabetes.  Sonographer:    Wallie Char Referring Phys: Lennox Solders [place] IMPRESSIONS  1. Left ventricular ejection fraction, by estimation, is 60 to 65%. The left ventricle has normal function. The left ventricle has no regional wall motion abnormalities. There is mild left ventricular hypertrophy of the basal-septal segment. Left ventricular diastolic parameters are indeterminate. Elevated left ventricular end-diastolic pressure.  2. Right ventricular systolic function is normal. The right ventricular size is mildly enlarged. There is mildly elevated pulmonary artery systolic pressure. The estimated right ventricular systolic pressure is 36.3 mmHg.  3. Right atrial size was mildly dilated.  4. The mitral valve is normal in structure. Trivial mitral valve regurgitation. No evidence of mitral stenosis.  5. Tricuspid valve regurgitation is moderate.  6. The aortic valve number of cusps is indeterminant. There is moderate calcification of the aortic valve. There is moderate thickening of the aortic valve. Aortic valve regurgitation  is moderate. Moderate to severe aortic valve stenosis. Aortic regurgitation PHT measures 442 to 546 msec. Aortic valve area, by VTI measures 0.81 cm. Aortic valve mean gradient measures 34.0 mmHg. Aortic valve Vmax measures 4.17 m/s. DVI 0.40. AVA is underestimated due to small LVOT diameter. Vmax increased due to  increaesd flow from significant AI.  7. The inferior vena  cava is normal in size with <50% respiratory variability, suggesting right atrial pressure of 8 mmHg. Conclusion(s)/Recommendation(s): No intracardiac source of embolism detected on this transthoracic study. Consider a transesophageal echocardiogram to exclude cardiac source of embolism if clinically indicated. FINDINGS  Left Ventricle: Left ventricular ejection fraction, by estimation, is 60 to 65%. The left ventricle has normal function. The left ventricle has no regional wall motion abnormalities. The left ventricular internal cavity size was normal in size. There is  mild left ventricular hypertrophy of the basal-septal segment. Left ventricular diastolic parameters are indeterminate. Elevated left ventricular end-diastolic pressure. Right Ventricle: The right ventricular size is mildly enlarged. No increase in right ventricular wall thickness. Right ventricular systolic function is normal. There is mildly elevated pulmonary artery systolic pressure. The tricuspid regurgitant velocity is 2.66 m/s, and with an assumed right atrial pressure of 8 mmHg, the estimated right ventricular systolic pressure is 36.3 mmHg. Left Atrium: Left atrial size was normal in size. Right Atrium: Right atrial size was mildly dilated. Pericardium: There is no evidence of pericardial effusion. Mitral Valve: The mitral valve is normal in structure. Trivial mitral valve regurgitation. No evidence of mitral valve stenosis. MV peak gradient, 2.1 mmHg. The mean mitral valve gradient is 1.0 mmHg. Tricuspid Valve: The tricuspid valve is normal in structure. Tricuspid valve  regurgitation is moderate . No evidence of tricuspid stenosis. Aortic Valve: AVA is underestimated due to small LVOT diameter. Vmax increased due to increaesd flow from significant AI. The aortic valve has an indeterminant number of cusps. There is moderate calcification of the aortic valve. There is moderate thickening of the aortic valve. Aortic valve regurgitation is moderate. Aortic regurgitation PHT measures 546 msec. Moderate aortic stenosis is present. Aortic valve mean gradient measures 34.0 mmHg. Aortic valve peak gradient measures 69.6 mmHg. Aortic valve area, by VTI measures 0.81 cm. Pulmonic Valve: The pulmonic valve was normal in structure. Pulmonic valve regurgitation is mild. No evidence of pulmonic stenosis. Aorta: The aortic root is normal in size and structure. Venous: The inferior vena cava is normal in size with less than 50% respiratory variability, suggesting right atrial pressure of 8 mmHg. IAS/Shunts: No atrial level shunt detected by color flow Doppler.  LEFT VENTRICLE PLAX 2D LVIDd:         4.10 cm     Diastology LVIDs:         2.70 cm     LV e' medial:    3.71 cm/s LV PW:         0.90 cm     LV E/e' medial:  20.6 LV IVS:        1.20 cm     LV e' lateral:   8.90 cm/s LVOT diam:     1.50 cm     LV E/e' lateral: 8.6 LV SV:         64 LV SV Index:   39 LVOT Area:     1.77 cm  LV Volumes (MOD) LV vol d, MOD A2C: 93.5 ml LV vol d, MOD A4C: 81.3 ml LV vol s, MOD A2C: 27.7 ml LV vol s, MOD A4C: 30.6 ml LV SV MOD A2C:     65.8 ml LV SV MOD A4C:     81.3 ml LV SV MOD BP:      61.2 ml RIGHT VENTRICLE             IVC RV Basal diam:  4.20 cm     IVC diam: 2.30 cm RV S  prime:     12.10 cm/s TAPSE (M-mode): 1.9 cm LEFT ATRIUM             Index        RIGHT ATRIUM           Index LA diam:        3.30 cm 1.97 cm/m   RA Area:     19.30 cm LA Vol (A2C):   29.4 ml 17.59 ml/m  RA Volume:   54.40 ml  32.56 ml/m LA Vol (A4C):   40.6 ml 24.30 ml/m LA Biplane Vol: 34.5 ml 20.65 ml/m  AORTIC VALVE AV Area  (Vmax):    0.67 cm AV Area (Vmean):   0.71 cm AV Area (VTI):     0.81 cm AV Vmax:           417.00 cm/s AV Vmean:          269.000 cm/s AV VTI:            0.792 m AV Peak Grad:      69.6 mmHg AV Mean Grad:      34.0 mmHg LVOT Vmax:         158.50 cm/s LVOT Vmean:        108.000 cm/s LVOT VTI:          0.364 m LVOT/AV VTI ratio: 0.46 AI PHT:            546 msec AR Vena Contracta: 0.40 cm  AORTA Ao Root diam: 3.40 cm MITRAL VALVE               TRICUSPID VALVE MV Area (PHT): 3.17 cm    TR Peak grad:   28.3 mmHg MV Area VTI:   2.51 cm    TR Mean grad:   16.0 mmHg MV Peak grad:  2.1 mmHg    TR Vmax:        266.00 cm/s MV Mean grad:  1.0 mmHg    TR Vmean:       191.0 cm/s MV Vmax:       0.72 m/s MV Vmean:      42.0 cm/s   SHUNTS MV Decel Time: 239 msec    Systemic VTI:  0.36 m MV E velocity: 76.60 cm/s  Systemic Diam: 1.50 cm MV A velocity: 70.10 cm/s MV E/A ratio:  1.09 Armanda Magic MD Electronically signed by Armanda Magic MD Signature Date/Time: 11/24/2022/2:02:43 PM    Final      LOS: 2 days   Jeoffrey Massed, MD  [facility]    To contact the attending provider between 7A-7P or the covering provider during after hours 7P-7A, please log into the web site [email or web address] and access using universal [place] password for that web site. If you do not have the password, please call the hospital operator.  11/26/2022, 10:34 AM

## 2022-11-26 NOTE — Progress Notes (Signed)
Patient ID: Steve Mcgrath, male   DOB: 11/22/1926, 89 y.o.   MRN: [id number] INR  Informed of patient bleeding for the RT groin puncture site. On arrival hemostasis obtained after 30 min of manual compression with quick clot as per nursing staff.  VS BP in the 140s/70s  HR SR in the 70s to 80s off levophed. RT groin soft without  palpable hematoma. Mod superficial bruising below the puncture site. Distal pulses dopplerable in the RT foot. Neurologically alert . Responding to questions appropriately. Pressure dressing with quick clot applied after further manual compression.  Plan . D/C IV heparin. X 2 units transfusion in progress. Patient to lie flat with RT leg  straight for x6 hrs . Apply immobilizer to RT leg for restraint for 6 hrs.  D/W spouse and patient.   S.Jaeleigh Monaco MD

## 2022-11-26 NOTE — Procedures (Signed)
INR.  Status post innominate artery right subclavian and right common carotid arteriograms.  Right CFA approach.  Findings. 1.  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  Status post balloon angioplasty of the severe innominate artery stenosis with a 6 mm x 30 mm balloon and a 7 mm x 15 mm balloon with improved caliber with a residual stenosis of approximately 50%  Improved hemodynamic flow seen into the  right subclavian and the right common carotid artery.  Postprocedural CT of the brain no evidence of intracranial hemorrhage.  Manual compression held with quick clot at the right groin puncture site for hemostasis.  Distal pulses all dopplerable in both feet unchanged.  Patient extubated.  Upon recovery, patient able to respond to simple commands appropriately.  Denies any headaches or  nausea  Pupils 3 mm round sluggishly reactive bilaterally.  No facial asymmetry.  Moves all fours equally proportional to effort.  S.Hermie Reagor MD  Fatima Sanger MD.

## 2022-11-26 NOTE — Anesthesia Preprocedure Evaluation (Addendum)
Anesthesia Evaluation  Patient identified by MRN, date of birth, ID band Patient awake    Reviewed: Allergy & Precautions, NPO status , Patient's Chart, lab work & pertinent test results  Airway Mallampati: II  TM Distance: >3 FB Neck ROM: Full    Dental  (+) Teeth Intact, Dental Advisory Given   Pulmonary former smoker   breath sounds clear to auscultation       Cardiovascular + CAD and + Past MI  + Valvular Problems/Murmurs  Rhythm:Regular Rate:Normal  Echo: 1. Left ventricular ejection fraction, by estimation, is 60 to 65%. The  left ventricle has normal function. The left ventricle has no regional  wall motion abnormalities. There is mild left ventricular hypertrophy of  the basal-septal segment. Left  ventricular diastolic parameters are indeterminate. Elevated left  ventricular end-diastolic pressure.   2. Right ventricular systolic function is normal. The right ventricular  size is mildly enlarged. There is mildly elevated pulmonary artery  systolic pressure. The estimated right ventricular systolic pressure is  36.3 mmHg.   3. Right atrial size was mildly dilated.   4. The mitral valve is normal in structure. Trivial mitral valve  regurgitation. No evidence of mitral stenosis.   5. Tricuspid valve regurgitation is moderate.   6. The aortic valve number of cusps is indeterminant. There is moderate  calcification of the aortic valve. There is moderate thickening of the  aortic valve. Aortic valve regurgitation is moderate. Moderate to severe  aortic valve stenosis. Aortic  regurgitation PHT measures 442 to 546 msec. Aortic valve area, by VTI  measures 0.81 cm. Aortic valve mean gradient measures 34.0 mmHg. Aortic  valve Vmax measures 4.17 m/s. DVI 0.40. AVA is underestimated due to small  LVOT diameter. Vmax increased due to   increaesd flow from significant AI.   7. The inferior vena cava is normal in size with <50%  respiratory  variability, suggesting right atrial pressure of 8 mmHg.     Neuro/Psych TIA negative psych ROS   GI/Hepatic negative GI ROS, Neg liver ROS,,,  Endo/Other  diabetesHypothyroidism    Renal/GU Renal disease     Musculoskeletal negative musculoskeletal ROS (+)    Abdominal   Peds  Hematology negative hematology ROS (+)   Anesthesia Other Findings   Reproductive/Obstetrics                             Anesthesia Physical Anesthesia Plan  ASA: 3  Anesthesia Plan: General   Post-op Pain Management: Tylenol PO (pre-op)*   Induction: Intravenous  PONV Risk Score and Plan: 3 and Ondansetron and Treatment may vary due to age or medical condition  Airway Management Planned: Oral ETT  Additional Equipment: Arterial line  Intra-op Plan:   Post-operative Plan: Extubation in OR  Informed Consent: I have reviewed the patients History and Physical, chart, labs and discussed the procedure including the risks, benefits and alternatives for the proposed anesthesia with the patient or authorized representative who has indicated his/her understanding and acceptance.     Dental advisory given  Plan Discussed with: CRNA  Anesthesia Plan Comments:        Anesthesia Quick Evaluation

## 2022-11-26 NOTE — Care Management Important Message (Signed)
Important Message  Patient Details  Name: Steve Mcgrath MRN: [id number] Date of Birth: Feb 24, 1927   Medicare Important Message Given:  Yes Patient lleft prior to IM delivery will mail copy to the patient home address.     Jourdan Maldonado 11/26/2022, 2:49 PM

## 2022-11-26 NOTE — Progress Notes (Signed)
NAME:  Steve Mcgrath, MRN:  [id number], DOB:  Feb 02, 1927, LOS: 2 ADMISSION DATE:  11/22/2022, CONSULTATION DATE:  4/26 REFERRING MD:  Iver Nestle, CHIEF COMPLAINT:  hemorrhagic shock   History of Present Illness:  Steve Mcgrath is a 89 y/o gentleman with a history of CAD, DM who presented on 4/23 with R sided vision loss upon awakening several days prior to admission that lasted for several hours. He described it as blurry vision and a dark spot centrally. He had resolution then recurrence of symptoms later that day. He saw an ophthalmologist the following day and was reported to have no abnormalities with his eye. His PCP referred him to the emergency department where he was diagnosed with a TIA. 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 On 4/26 he underwent angiography and balloon angioplasty of the R innominate artery with improvement in flow in the R subclavian and R common carotid. In the evening on 4/26 he developed dizziness and nausea and was found to have bleeding from his catheter access site. Compression was held for 30 min and heparin was discontinued. BP dropped and he required norepinephrine to be started to maintain MAP >65.  RBCs ordered for transfusion. PCCM was consulted for assistance with hemorrhagic shock.  Pertinent  Medical History  Hypothyroidism CAD w/ NSTEMI DM CKD 3 BPH HTN Macular degeneration  Significant Hospital Events: Including procedures, antibiotic start and stop dates in addition to other pertinent events   4/23 admitted 4/25 ongoing intermittent vision symptoms.  4/26 NIR arteriograms of innominate, R subclavian, and R carotid, s/p balloon angioplasty of innominate artery stenosis from R femoral access site. Extubated post-op.   Interim History / Subjective:    Objective   Blood pressure (!) 101/48, pulse (!) 59,  temperature 97.9 F (36.6 C), temperature source Oral, resp. rate 15, height 5\' 4"  (1.626 m), weight 62.6 kg, SpO2 94 %.        Intake/Output Summary (Last 24 hours) at 11/26/2022 2303 Last data filed at 11/26/2022 2100 Gross per 24 hour  Intake 2833 ml  Output 25 ml  Net 2808 ml   Filed Weights   11/22/22 1555  Weight: 62.6 kg    Examination: General: ill appearing man lying in bed in NAD HENT: [place]/AT, eyes anicteric Lungs: breathing comfortably on RA, CTAB Cardiovascular: S1S2, RRR Abdomen: soft, NT Extremities: nurse holding pressure in R groin, no active bleeding with compression. No peripheral edema.  Neuro: Hard of heading, answering questions appropriately and comprehension appears intact. Lying still in the bed.   Derm: pallor, dry, no rashes  Resolved Hospital Problem list     Assessment & Plan:  Acute blood loss anemia Hemorrhagic shock due to femoral cannulation site bleeding -check STAT LA, CBC, coags -con't holding heparin -hold AP meds tonight -2 units pRBCs STAT; will transfuse for hemodynamic stability and Hb >7. Consented for blood by Dr. Iver Nestle. -has 4 PIVs for resuscitation. No need for CVC currently. -Dr Corliss Skains at bedside. 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 -DAPT-- holding tonight, should be able to resume tomorrow with close monitoring of femoral access site -statin -appreciate Neurology's management  Hypocalcemia -repleted 2g Ca+ gluconate  Wife  updated at bedside and she agrees with the plan.  Discussed plan at bedside with Neurology.  Best Practice (right click and "Reselect all SmartList Selections" daily)   Diet/type: NPO DVT prophylaxis: SCD; hlding chemical prophylaxis due to bleeding GI prophylaxis: PPI Lines:  N/A Foley:  N/A Code Status:  full code Last date of multidisciplinary goals of care discussion [ ]   Labs   CBC: Recent Labs  Lab 11/22/22 1601 11/26/22 0547  WBC 6.6 6.2  NEUTROABS 3.9  --   HGB 11.8* 12.2*  HCT 35.2* 36.0*  MCV 90.0 89.3  PLT 175 173    Basic Metabolic Panel: Recent Labs  Lab 11/22/22 1601 11/24/22 0321 11/26/22 0547  NA 137 137 137  K 4.2 4.0 4.2  CL 106 104 107  CO2 23 22 22   GLUCOSE 129* 99 96  BUN 38* 27* 33*  CREATININE 1.36* 1.42* 1.48*  CALCIUM 8.9 9.2 8.7*   GFR: Estimated Creatinine Clearance: 26.1 mL/min (A) (by C-G formula based on SCr of 1.48 mg/dL (H)). Recent Labs  Lab 11/22/22 1601 11/26/22 0547  WBC 6.6 6.2    Liver Function Tests: Recent Labs  Lab 11/22/22 1601  AST 18  ALT 14  ALKPHOS 64  BILITOT 0.4  PROT 7.0  ALBUMIN 3.9   No results for input(s): "LIPASE", "AMYLASE" in the last 168 hours. No results for input(s): "AMMONIA" in the last 168 hours.  ABG No results found for: "PHART", "PCO2ART", "PO2ART", "HCO3", "TCO2", "ACIDBASEDEF", "O2SAT"   Coagulation Profile: Recent Labs  Lab 11/22/22 1617  INR 1.1    Cardiac Enzymes: No results for input(s): "CKTOTAL", "CKMB", "CKMBINDEX", "TROPONINI" in the last 168 hours.  HbA1C: Hgb A1c MFr Bld  Date/Time Value Ref Range Status  11/24/2022 03:21 AM 6.0 (H) 4.8 - 5.6 % Final    Comment:    (NOTE)         Prediabetes: 5.7 - 6.4         Diabetes: >6.4         Glycemic control for adults with diabetes: <7.0     CBG: Recent Labs  Lab 11/25/22 0958 11/26/22 0749 11/26/22 1624 11/26/22 1847  GLUCAP 109* 104* 98 139*    Review of Systems:   Review of Systems  Constitutional:  Positive for malaise/fatigue.  Respiratory:  Negative for shortness of breath.   Gastrointestinal:  Positive for nausea. Negative for vomiting.  Musculoskeletal: Negative.   Neurological:  Positive for dizziness and weakness.     Past Medical History:  He,  has a past  medical history of CAD (coronary artery disease), DM (diabetes mellitus) (HCC), Enlarged prostate, Hypothyroid, Kidney stones, MI, acute, non ST segment elevation (HCC), and Murmur.   Surgical History:   Past Surgical History:  Procedure Laterality Date   CORONARY ARTERY BYPASS GRAFT     TONSILLECTOMY       Social History:   reports that he has quit smoking. His smoking use included cigarettes. He does not have any smokeless tobacco history on file. He reports that he does not drink alcohol.   Family History:  His family history is not on file.   Allergies No Known Allergies   Home Medications  Prior to Admission medications   Medication Sig Start Date End Date Taking? Authorizing Provider  alendronate (FOSAMAX) 70 MG tablet Take by mouth. 09/10/21  Yes [provider]  amLODipine (NORVASC) 10 MG tablet Take 1 tablet by mouth daily. 09/27/22 11/04/23 Yes [provider]  aspirin  EC 81 MG tablet Take 1 tablet by mouth daily. 08/17/16  Yes [provider]  atorvastatin (LIPITOR) 40 MG tablet Take 1 tablet by mouth at bedtime. 08/29/18  Yes [provider]  calcium carbonate (SUPER CALCIUM) 1500 (600 Ca) MG TABS tablet Take by mouth daily. 01/13/22  Yes [provider]  cholecalciferol (VITAMIN D3) 25 MCG (1000 UNIT) tablet Take 1,000 Units by mouth daily. 10/13/18  Yes [provider]  cyanocobalamin (VITAMIN B12) 1000 MCG tablet Take 1,000 mcg by mouth daily. 07/31/21  Yes [provider]  hydrochlorothiazide (MICROZIDE) 12.5 MG capsule Take 12.5 mg by mouth daily. 09/17/14  Yes [provider]  levothyroxine (SYNTHROID) 100 MCG tablet Take 100 mcg by mouth daily before breakfast. 08/05/22  Yes [provider]  omeprazole (PRILOSEC) 40 MG capsule Take 1 capsule by mouth daily. 12/15/21  Yes [provider]  COVID-19 At Home Antigen Test [facility] COVID-19 HOME TEST) KIT Use as directed Patient not taking:  Reported on 11/23/2022 08/03/21   Gwenlyn Fudge, [facility]  COVID-19 At Home Antigen Test [facility] COVID-19 HOME TEST) KIT Use as directed Patient not taking: Reported on 11/23/2022 08/03/21   Gwenlyn Fudge, [facility]  molnupiravir EUA (LAGEVRIO) 200 MG CAPS capsule Take 4 capsules by mouth twice daily for 5 days Patient not taking: Reported on 11/23/2022 08/03/21   Cathren Laine, MD     Critical care time:       This patient is critically ill with multiple organ system failure which requires frequent high complexity decision making, assessment, support, evaluation, and titration of therapies. This was completed through the application of advanced monitoring technologies and extensive interpretation of multiple databases. During this encounter critical care time was devoted to patient care services described in this note for 40 minutes.  Steffanie Dunn, DO 11/26/22 11:59 PM  Pulmonary & Critical Care  For contact information, see Amion. If no response to pager, please call PCCM consult pager. After hours, 7PM- 7AM, please call Elink.

## 2022-11-26 NOTE — Transfer of Care (Signed)
Immediate Anesthesia Transfer of Care Note  Patient: Steve Mcgrath  Procedure(s) Performed: Cerebral angioplasty with stenting  Patient Location: PACU  Anesthesia Type:General  Level of Consciousness: awake, alert , and oriented  Airway & Oxygen Therapy: Patient Spontanous Breathing and Patient connected to face mask oxygen  Post-op Assessment: Report given to RN and Post -op Vital signs reviewed and stable  Post vital signs: Reviewed and stable  Last Vitals: see postop VS flowsheet  Vitals Value Taken Time  BP    Temp    Pulse    Resp    SpO2      Last Pain:  Vitals:   11/26/22 1142  TempSrc: Oral  PainSc: 0-No pain         Complications: No notable events documented.

## 2022-11-26 NOTE — Anesthesia Procedure Notes (Signed)
Arterial Line Insertion Start/End4/26/2024 12:05 PM Performed by: Garfield Cornea, CRNA, CRNA  Patient location: Pre-op. Preanesthetic checklist: patient identified, IV checked, site marked, risks and benefits discussed, surgical consent, monitors and equipment checked, pre-op evaluation, timeout performed and anesthesia consent Lidocaine 1% used for infiltration Left, radial was placed Catheter size: 20 G Hand hygiene performed  and maximum sterile barriers used   Attempts: 1 Procedure performed without using ultrasound guided technique. Following insertion, dressing applied and Biopatch. Post procedure assessment: normal and unchanged  Patient tolerated the procedure well with no immediate complications.

## 2022-11-27 ENCOUNTER — Encounter (HOSPITAL_COMMUNITY): Payer: Self-pay | Admitting: Interventional Radiology

## 2022-11-27 DIAGNOSIS — I771 Stricture of artery: Secondary | ICD-10-CM | POA: Diagnosis not present

## 2022-11-27 DIAGNOSIS — H3411 Central retinal artery occlusion, right eye: Secondary | ICD-10-CM | POA: Diagnosis not present

## 2022-11-27 DIAGNOSIS — G459 Transient cerebral ischemic attack, unspecified: Secondary | ICD-10-CM | POA: Diagnosis not present

## 2022-11-27 LAB — CBC WITH DIFFERENTIAL/PLATELET
Abs Immature Granulocytes: 0.03 10*3/uL (ref 0.00–0.07)
Basophils Absolute: 0 10*3/uL (ref 0.0–0.1)
Basophils Relative: 0 %
Eosinophils Absolute: 0 10*3/uL (ref 0.0–0.5)
Eosinophils Relative: 0 %
HCT: 36.7 % — ABNORMAL LOW (ref 39.0–52.0)
Hemoglobin: 12.4 g/dL — ABNORMAL LOW (ref 13.0–17.0)
Immature Granulocytes: 1 %
Lymphocytes Relative: 6 %
Lymphs Abs: 0.3 10*3/uL — ABNORMAL LOW (ref 0.7–4.0)
MCH: 30.2 pg (ref 26.0–34.0)
MCHC: 33.8 g/dL (ref 30.0–36.0)
MCV: 89.5 fL (ref 80.0–100.0)
Monocytes Absolute: 0 10*3/uL — ABNORMAL LOW (ref 0.1–1.0)
Monocytes Relative: 1 %
Neutro Abs: 4.7 10*3/uL (ref 1.7–7.7)
Neutrophils Relative %: 92 %
Platelets: 154 10*3/uL (ref 150–400)
RBC: 4.1 MIL/uL — ABNORMAL LOW (ref 4.22–5.81)
RDW: 13.5 % (ref 11.5–15.5)
WBC: 5.1 10*3/uL (ref 4.0–10.5)
nRBC: 0 % (ref 0.0–0.2)

## 2022-11-27 LAB — LACTIC ACID, PLASMA: Lactic Acid, Venous: 0.9 mmol/L (ref 0.5–1.9)

## 2022-11-27 LAB — BPAM RBC
Blood Product Expiration Date: 202405012359
Blood Product Expiration Date: 202405032359
ISSUE DATE / TIME: 202404262315
Unit Type and Rh: 600

## 2022-11-27 LAB — BASIC METABOLIC PANEL
Anion gap: 7 (ref 5–15)
BUN: 31 mg/dL — ABNORMAL HIGH (ref 8–23)
CO2: 17 mmol/L — ABNORMAL LOW (ref 22–32)
Calcium: 8.6 mg/dL — ABNORMAL LOW (ref 8.9–10.3)
Chloride: 112 mmol/L — ABNORMAL HIGH (ref 98–111)
Creatinine, Ser: 1.3 mg/dL — ABNORMAL HIGH (ref 0.61–1.24)
GFR, Estimated: 51 mL/min — ABNORMAL LOW (ref >60)
Glucose, Bld: 123 mg/dL — ABNORMAL HIGH (ref 70–99)
Potassium: 4 mmol/L (ref 3.5–5.1)
Sodium: 136 mmol/L (ref 135–145)

## 2022-11-27 LAB — TYPE AND SCREEN
Antibody Screen: NEGATIVE
Unit division: 0
Unit division: 0

## 2022-11-27 LAB — PROTIME-INR
INR: 1.3 — ABNORMAL HIGH (ref 0.8–1.2)
Prothrombin Time: 16 seconds — ABNORMAL HIGH (ref 11.4–15.2)

## 2022-11-27 LAB — APTT: aPTT: 31 seconds (ref 24–36)

## 2022-11-27 MED ORDER — CLEVIDIPINE BUTYRATE 0.5 MG/ML IV EMUL
0.0000 mg/h | INTRAVENOUS | Status: DC
  Administered 2022-11-27: 4 mg/h via INTRAVENOUS
  Filled 2022-11-27: qty 50

## 2022-11-27 MED ORDER — CARVEDILOL 3.125 MG PO TABS
6.2500 mg | ORAL_TABLET | Freq: Two times a day (BID) | ORAL | Status: DC
  Administered 2022-11-27 – 2022-11-29 (×4): 6.25 mg via ORAL
  Filled 2022-11-27 (×5): qty 2

## 2022-11-27 MED ORDER — HYDRALAZINE HCL 20 MG/ML IJ SOLN: 10.0000 mg | Freq: Four times a day (QID) | INTRAMUSCULAR | Status: DC | PRN

## 2022-11-27 MED ORDER — MELATONIN 3 MG PO TABS
3.0000 mg | ORAL_TABLET | Freq: Every day | ORAL | Status: DC
  Administered 2022-11-27 – 2022-11-28 (×2): 3 mg via ORAL
  Filled 2022-11-27 (×2): qty 1

## 2022-11-27 MED ORDER — MENTHOL 3 MG MT LOZG
1.0000 | LOZENGE | OROMUCOSAL | Status: DC | PRN
  Filled 2022-11-27: qty 9

## 2022-11-27 NOTE — Progress Notes (Signed)
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 On 4/26 he underwent angiography and balloon angioplasty of the R innominate artery with improvement in flow in the R subclavian and R common carotid. In the evening on 4/26 he developed dizziness and nausea and was found to have bleeding from his catheter access site. BP dropped and he required norepinephrine to be started to maintain MAP >65.  PCCM consulted.  Transfusion ordered.  Resuscitation and evaluation ongoing.  eICU Interventions  Chart reviewed     Intervention Category Evaluation Type: New Patient Evaluation  Henry Russel, P 11/27/2022, 12:27 AM

## 2022-11-27 NOTE — Progress Notes (Signed)
Patient back on cleviprex drip overnight . Discussed with PCCM team. Patient will stay in ICU with critical care for another 24 hours. There is potential for transfer tomorrow .  Patient not seen by [facility] team today.  Since patient is ready to transfer out, will keep on follow-up list for tomorrow.  No face-to-face encounter/no charge.

## 2022-11-27 NOTE — Progress Notes (Signed)
Patient remains off clevidipine.  No vasoactive infusions.  Liberalization of blood pressure goal per neuro IR this afternoon.  If patient remains off vasoactive infusions throughout the night, plan for TRH to resume care 4/28.  Per bedside RN, neuro IR has requested patient remain in ICU through the night.

## 2022-11-27 NOTE — Progress Notes (Signed)
Patient ID: Steve Mcgrath, male   DOB: 19-Oct-1926, 89 y.o.   MRN: [id number] INR  Patient sitting up in bed in no acute distress.Better color.  Complaining of no change in his vision in the right eye.  Reports blurring versus scintillations scotomata.  Able to discern  objects with his right eye. Denies any headaches nausea vomiting, or speech or motor difficulties. Neurologically alert.  Patient oriented to place and year.  Speech clear.  Pupils 3 mm round sluggish.   Eye Movements intact in  horizontal and vertical planes without subjective diplopia. Moves all fours equally.  Right groin  appears soft without bruising no palpable hematoma.  Distal pulses remain dopplerable in both feet.  Labs.  Improved hemoglobin to 12.4.  Renal functions stable.    Plan . 1.Continue with dual antiplatelets. 2.BP range 120 to 160 ,preferably towards the upper end. 3. Maintain manual pressure in the RT groin when ambulating. S.Acelyn Basham MD

## 2022-11-27 NOTE — Progress Notes (Signed)
Orthopedic Tech Progress Note Patient Details:  SELIM DURDEN 18-Apr-1927 [id number]  Knee immobilizer placed to RLE. Posterior struts were initially irritating the pt so ABD pads were added as a cushion to avoid potential skin breakdown or pressure wounds.   Ortho Devices Type of Ortho Device: Knee Immobilizer Ortho Device/Splint Location: RLE Ortho Device/Splint Interventions: Ordered, Application, Adjustment   Post Interventions Patient Tolerated: Well Instructions Provided: Care of device, Adjustment of device  Conya Ellinwood Carmine Savoy 11/27/2022, 1:21 PM

## 2022-11-27 NOTE — Progress Notes (Signed)
Patient HOB raised between 2205 and 2210. 2200 vital signs within normal limits, neuro status unchanged, A+Ox4, able to move all extremities, vascular check WDL and R fem site level 0. Patient had previously mentioned vision changes. Patient reported feelings of nausea. Patient's blood pressure noted to decline at 2245 to 111/38. Cleviprex was titrated down. Patient reported feelings of lightheadedness and dizziness. Cleviprex was turned off, patient HOB lowered, R fem site was noted to have active bleeding and direct pressure was applied. Dr. Iver Nestle and Dr. Corliss Skains were paged and CCM consulted. Pressure was held for 30 minutes total and Dr. Corliss Skains redressed the site.   New orders for vasopressor initiation and 2 units blood transfusion placed. HOB flat for additional 6 hours and knee immobilizer orders placed.

## 2022-11-27 NOTE — Progress Notes (Addendum)
STROKE TEAM PROGRESS NOTE   INTERVAL HISTORY His daughter and son are at the bedside.  Patient is sitting up in bed in no apparent distress.  Overnight patient experiences experienced bleeding from his right groin site, required transfer to ICU with use of pressors, 2 units PRBC transfused and heparin IV was DC'd.  This morning he is off pressors. Keep in ICU for close monitoring of vitals, procedure site, neuro checks.   On exam patient continues to have issues with his right vision, but states that has improved.  Patient is able to see light, waving fingers but is unable to tell how many fingers are held up.  Able to move all extremities with no issues, no aphasia or dysarthria noted.  Vitals:   11/27/22 1030 11/27/22 1038 11/27/22 1045 11/27/22 1100  BP:      Pulse: 83 79 71 71  Resp: 16 19 16  (!) 21  Temp:      TempSrc:      SpO2: 92% 92% 92% 93%  Weight:      Height:       CBC:  Recent Labs  Lab 11/22/22 1601 11/26/22 0547 11/26/22 2311 11/27/22 0512  WBC 6.6   < > 10.7* 5.1  NEUTROABS 3.9  --   --  4.7  HGB 11.8*   < > 10.1* 12.4*  HCT 35.2*   < > 31.0* 36.7*  MCV 90.0   < > 92.0 89.5  PLT 175   < > 194 154   < > = values in this interval not displayed.    Basic Metabolic Panel:  Recent Labs  Lab 11/26/22 2311 11/27/22 0512  NA 137 136  K 3.7 4.0  CL 112* 112*  CO2 18* 17*  GLUCOSE 160* 123*  BUN 32* 31*  CREATININE 1.33* 1.30*  CALCIUM 7.6* 8.6*    Lipid Panel:  Recent Labs  Lab 11/24/22 0321  CHOL 166  TRIG 75  HDL 49  CHOLHDL 3.4  VLDL 15  LDLCALC 213*    HgbA1c:  Recent Labs  Lab 11/24/22 0321  HGBA1C 6.0*    Urine Drug Screen: No results for input(s): "LABOPIA", "COCAINSCRNUR", "LABBENZ", "AMPHETMU", "THCU", "LABBARB" in the last 168 hours.  Alcohol Level  Recent Labs  Lab 11/22/22 1713  ETH <10     IMAGING past 24 hours No results found.  PHYSICAL EXAM  Temp:  [97.6 F (36.4 C)-98.9 F (37.2 C)] 98.9 F (37.2 C) (04/27  0800) Pulse Rate:  [48-83] 71 (04/27 1100) Resp:  [11-25] 21 (04/27 1100) BP: (64-155)/(33-87) 117/61 (04/27 1000) SpO2:  [90 %-97 %] 93 % (04/27 1100) Arterial Line BP: (82-173)/(30-56) 124/45 (04/27 1100)  General - Well nourished, well developed, in no apparent distress. Cardiovascular - Regular rhythm and rate.  Harsh ejection systolic murmur heard throughout precordium and in the neck  Mental Status -  Level of arousal and orientation to time, place, and person were intact. Language including expression, naming, repetition, comprehension was assessed and found intact. Attention span and concentration were normal. Recent and remote memory were intact. Fund of Knowledge was assessed and was intact.  Cranial Nerves II - XII - II - Visual field intact OU.  Subjective blurred vision in the right eye, able to see fingers wiggle but unable to count fingers. III, IV, VI - Extraocular movements intact. V - Facial sensation intact bilaterally. VII - Facial movement intact bilaterally. VIII - Hearing & vestibular intact bilaterally. X - Palate elevates symmetrically. XI -  Chin turning & shoulder shrug intact bilaterally. XII - Tongue protrusion intact.  Motor Strength - The patient's strength was normal in all extremities and pronator drift was absent.  Bulk was normal and fasciculations were absent.   Motor Tone - Muscle tone was assessed at the neck and appendages and was normal.  Sensory - Light touch, temperature/pinprick were assessed and were symmetrical.    Coordination - The patient had normal movements in the hands and feet with no ataxia or dysmetria.  Tremor was absent.  Gait and Station - deferred.  ASSESSMENT/PLAN [name] is a 89 y.o. male with history of ignificant of CAD with CABG, IIDM, CKD stage II, hypothyroidism, presented with transient vision loss.   R CRAO: Likely due to large vessel disease from symptomatic brachiocephalic origin stenosis CT head No  acute abnormality.  CTA head & neck - no LVO.  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  Off heparin IV Therapy recommendations: Pending Disposition: Pending  Right groin hemorrhage Postprocedure hemorrhage, right groin Anemia with hemoglobin dropping Status post 2 unit PRBC Off pressor Continue monitoring  Hypertension Home meds: Norvasc 10 mg, HCTZ 12.5 mg Per IR, goal BP range 120-160. Avoid hypotension.  Long-term BP goal normotensive  Hyperlipidemia Home meds: Atorvastatin 40, resumed in hospital LDL 102, goal < 70 Increased atorvastatin to 80 mg Continue statin at discharge  Other Stroke Risk Factors Advanced Age >/= 74  Coronary artery disease status post CABG  Other Active Problems Hypothyroidism   ATTENDING NOTE: I reviewed above note and agree with the assessment and plan. Pt was seen and examined.   Son and daughter are at the bedside. Pt reclining in bed, lethargic with hard of hearing, but AAO x 3, still state that his right eye blurry vision, not back to previous baseline. On exam, right eye can see hand waving but not finger counting. Otherwise, moving all extremities, no focal deficit. Right CRAO likely due to severe stenosis of brachiocephalic A. S/p stenting, on DAPT. Was on heparin IV last night but developed right groin hemorrhage with dropping Hb, s/p 2 Units PRBC and transient pressor, now Hb stable, off pressor and right groin soft. Slight pain on hip movement. Pending PT/OT. On DAPT and statin. Will  follow  For detailed assessment and plan, please refer to above/below as I have made changes wherever appropriate.   This patient is critically ill due to right CRAO due to brachiocephalic artery high-grade stenosis, s/p stenting, hypertensive urgency, right groin bleeding with anemia and at significant risk of neurological worsening, death form stroke, pseudoaneurysm, severe anemia. This patient's care requires constant monitoring of vital signs, hemodynamics, respiratory and cardiac monitoring, review of multiple databases, neurological assessment, discussion with family, other specialists and medical decision making of high complexity. I spent 45 minutes of neurocritical care time in the care of this patient. I had long discussion with daughter and son at bedside, updated pt current condition, treatment plan and potential prognosis, and answered all the questions.  Today expressed understanding and appreciation.  Marvel Plan, MD PhD Stroke Neurology 11/27/2022 6:31 PM     To contact Stroke Continuity provider, please refer to [email or web address]. After hours, contact General Neurology

## 2022-11-27 NOTE — Progress Notes (Addendum)
SLP Cancellation Note  Patient Details Name: Steve Mcgrath MRN: [id number] DOB: 12-24-1926   Cancelled treatment:       Reason Eval/Treat Not Completed: Other (comment) orders received for clinical swallow evaluation 4/27. Per RN, pt not having present difficulties, no longer needing swallow evaluation. Please reconsult if any future swallow difficulty arises. Screened for speech language evaluation and no acute cognitive linguistic changes reported from pt or staff.    Ardyth Gal MA, CCC-SLP Acute Rehabilitation Services   11/27/2022, 10:03 AM

## 2022-11-27 NOTE — Progress Notes (Signed)
NAME:  Steve Mcgrath, MRN:  [id number], DOB:  1927-06-01, LOS: 3 ADMISSION DATE:  11/22/2022, CONSULTATION DATE:  4/26 REFERRING MD:  Iver Nestle, CHIEF COMPLAINT:  hemorrhagic shock   History of Present Illness:  [name] is a 89 y/o gentleman with a history of CAD, DM who presented on 4/23 with R sided vision loss upon awakening several days prior to admission that lasted for several hours. He described it as blurry vision and a dark spot centrally. He had resolution then recurrence of symptoms later that day. He saw an ophthalmologist the following day and was reported to have no abnormalities with his eye. His PCP referred him to the emergency department where he was diagnosed with a TIA. 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 On 4/26 he underwent angiography and balloon angioplasty of the R innominate artery with improvement in flow in the R subclavian and R common carotid. In the evening on 4/26 he developed dizziness and nausea and was found to have bleeding from his catheter access site. Compression was held for 30 min and heparin was discontinued. BP dropped and he required norepinephrine to be started to maintain MAP >65.  RBCs ordered for transfusion. PCCM was consulted for assistance with hemorrhagic shock.  Pertinent  Medical History  Hypothyroidism CAD w/ NSTEMI DM CKD 3 BPH HTN Macular degeneration  Significant Hospital Events: Including procedures, antibiotic start and stop dates in addition to other pertinent events   4/23 admitted 4/25 ongoing intermittent vision symptoms.  4/26 NIR arteriograms of innominate, R subclavian, and R carotid, s/p balloon angioplasty of innominate artery stenosis from R femoral access site. Extubated post-op.  4/27 BP now requiring clevidipine for high BP per gaols of Neuro IR  Interim History /  Subjective:  Since responded to resuscitation.  Now blood pressure is a bit high on clevidipine for goal 1 20-1 50.  Neuro IR.  Review of this morning same as it was day prior, after receiving transfusion for bleeding at groin site overnight.  Objective   Blood pressure 117/61, pulse 79, temperature 98.9 F (37.2 C), temperature source Axillary, resp. rate 14, height 5\' 4"  (1.626 m), weight 62.6 kg, SpO2 94 %.        Intake/Output Summary (Last 24 hours) at 11/27/2022 1010 Last data filed at 11/27/2022 1000 Gross per 24 hour  Intake 4763.19 ml  Output 825 ml  Net 3938.19 ml    Filed Weights   11/22/22 1555  Weight: 62.6 kg    Examination: General: ill appearing man lying in bed in NAD HENT: [place]/AT, eyes anicteric Lungs: breathing comfortably on RA, CTAB Cardiovascular: S1S2, RRR Abdomen: soft, NT  Neuro: Hard of heading, answering questions appropriately and comprehension appears intact.  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 -Continue aspirin, holding Plavix, resume per neuro/neuro IR -statin -appreciate Neurology's management  Hypertension: -- Goal SBP 120-150 -- Continue clevidipine, continue home amlodipine, start low-dose Coreg, discontinue home hydrochlorothiazide, not best option  for nonagenarian  Best Practice (right click and "Reselect all SmartList Selections" daily)   Diet/type: NPO DVT prophylaxis: SCD; hlding chemical prophylaxis due to bleeding GI prophylaxis: PPI Lines: N/A Foley:  N/A Code Status:   full code Last date of multidisciplinary goals of care discussion [ ]   Labs   CBC: Recent Labs  Lab 11/22/22 1601 11/26/22 0547 11/26/22 2311 11/27/22 0512  WBC 6.6 6.2 10.7* 5.1  NEUTROABS 3.9  --   --  4.7  HGB 11.8* 12.2* 10.1* 12.4*  HCT 35.2* 36.0* 31.0* 36.7*  MCV 90.0 89.3 92.0 89.5  PLT 175 173 194 154     Basic Metabolic Panel: Recent Labs  Lab 11/22/22 1601 11/24/22 0321 11/26/22 0547 11/26/22 2311 11/27/22 0512  NA 137 137 137 137 136  K 4.2 4.0 4.2 3.7 4.0  CL 106 104 107 112* 112*  CO2 23 22 22  18* 17*  GLUCOSE 129* 99 96 160* 123*  BUN 38* 27* 33* 32* 31*  CREATININE 1.36* 1.42* 1.48* 1.33* 1.30*  CALCIUM 8.9 9.2 8.7* 7.6* 8.6*    GFR: Estimated Creatinine Clearance: 29.7 mL/min (A) (by C-G formula based on SCr of 1.3 mg/dL (H)). Recent Labs  Lab 11/22/22 1601 11/26/22 0547 11/26/22 2311 11/27/22 0235 11/27/22 0512  WBC 6.6 6.2 10.7*  --  5.1  LATICACIDVEN  --   --   --  0.9  --      Liver Function Tests: Recent Labs  Lab 11/22/22 1601  AST 18  ALT 14  ALKPHOS 64  BILITOT 0.4  PROT 7.0  ALBUMIN 3.9    No results for input(s): "LIPASE", "AMYLASE" in the last 168 hours. No results for input(s): "AMMONIA" in the last 168 hours.  ABG No results found for: "PHART", "PCO2ART", "PO2ART", "HCO3", "TCO2", "ACIDBASEDEF", "O2SAT"   Coagulation Profile: Recent Labs  Lab 11/22/22 1617 11/27/22 0518  INR 1.1 1.3*     Cardiac Enzymes: No results for input(s): "CKTOTAL", "CKMB", "CKMBINDEX", "TROPONINI" in the last 168 hours.  HbA1C: Hgb A1c MFr Bld  Date/Time Value Ref Range Status  11/24/2022 03:21 AM 6.0 (H) 4.8 - 5.6 % Final    Comment:    (NOTE)         Prediabetes: 5.7 - 6.4         Diabetes: >6.4         Glycemic control for adults with diabetes: <7.0     CBG: Recent Labs  Lab 11/25/22 0958 11/26/22 0749 11/26/22 1624 11/26/22 1847  GLUCAP 109* 104* 98 139*     Review of Systems:   N/a   Past Medical  History:  He,  has a past medical history of CAD (coronary artery disease), DM (diabetes mellitus) (HCC), Enlarged prostate, Hypothyroid, Kidney stones, MI, acute, non ST segment elevation (HCC), and Murmur.   Surgical History:   Past Surgical History:  Procedure Laterality Date   CORONARY ARTERY BYPASS GRAFT     RADIOLOGY WITH ANESTHESIA N/A 11/26/2022   Procedure: Cerebral angioplasty with stenting;  Surgeon: Julieanne Cotton, MD;  Location: [facility] OR;  Service: Radiology;  Laterality: N/A;   TONSILLECTOMY       Social History:   reports that he has quit smoking. His smoking use included cigarettes. He does not have any smokeless tobacco history on file. He reports that he does not drink alcohol.   Family History:  His family history is not on file.   Allergies No Known Allergies   Home Medications  Prior to  Admission medications   Medication Sig Start Date End Date Taking? Authorizing Provider  alendronate (FOSAMAX) 70 MG tablet Take by mouth. 09/10/21  Yes [provider]  amLODipine (NORVASC) 10 MG tablet Take 1 tablet by mouth daily. 09/27/22 11/04/23 Yes [provider]  aspirin EC 81 MG tablet Take 1 tablet by mouth daily. 08/17/16  Yes [provider]  atorvastatin (LIPITOR) 40 MG tablet Take 1 tablet by mouth at bedtime. 08/29/18  Yes [provider]  calcium carbonate (SUPER CALCIUM) 1500 (600 Ca) MG TABS tablet Take by mouth daily. 01/13/22  Yes [provider]  cholecalciferol (VITAMIN D3) 25 MCG (1000 UNIT) tablet Take 1,000 Units by mouth daily. 10/13/18  Yes [provider]  cyanocobalamin (VITAMIN B12) 1000 MCG tablet Take 1,000 mcg by mouth daily. 07/31/21  Yes [provider]  hydrochlorothiazide (MICROZIDE) 12.5 MG capsule Take 12.5 mg by mouth daily. 09/17/14  Yes [provider]  levothyroxine (SYNTHROID) 100 MCG tablet Take 100 mcg by mouth daily before breakfast. 08/05/22  Yes [provider]   omeprazole (PRILOSEC) 40 MG capsule Take 1 capsule by mouth daily. 12/15/21  Yes [provider]  COVID-19 At Home Antigen Test [facility] COVID-19 HOME TEST) KIT Use as directed Patient not taking: Reported on 11/23/2022 08/03/21   Gwenlyn Fudge, [facility]  COVID-19 At Home Antigen Test [facility] COVID-19 HOME TEST) KIT Use as directed Patient not taking: Reported on 11/23/2022 08/03/21   Gwenlyn Fudge, [facility]  molnupiravir EUA (LAGEVRIO) 200 MG CAPS capsule Take 4 capsules by mouth twice daily for 5 days Patient not taking: Reported on 11/23/2022 08/03/21   Cathren Laine, MD     Critical care time:      CRITICAL CARE Performed by: Karren Burly   Total critical care time: 32 minutes  Critical care time was exclusive of separately billable procedures and treating other patients.  Critical care was necessary to treat or prevent imminent or life-threatening deterioration.  Critical care was time spent personally by me on the following activities: development of treatment plan with patient and/or surrogate as well as nursing, discussions with consultants, evaluation of patient's response to treatment, examination of patient, obtaining history from patient or surrogate, ordering and performing treatments and interventions, ordering and review of laboratory studies, ordering and review of radiographic studies, pulse oximetry and re-evaluation of patient's condition.    Karren Burly, MD 11/27/22 10:10 AM [facility]  For contact information, see Amion. If no response to pager, please call PCCM consult pager. After hours, 7PM- 7AM, please call Elink.

## 2022-11-28 DIAGNOSIS — I771 Stricture of artery: Secondary | ICD-10-CM | POA: Diagnosis not present

## 2022-11-28 DIAGNOSIS — G459 Transient cerebral ischemic attack, unspecified: Secondary | ICD-10-CM | POA: Diagnosis not present

## 2022-11-28 DIAGNOSIS — H3411 Central retinal artery occlusion, right eye: Secondary | ICD-10-CM | POA: Diagnosis not present

## 2022-11-28 LAB — CBC
HCT: 30.6 % — ABNORMAL LOW (ref 39.0–52.0)
Hemoglobin: 10.7 g/dL — ABNORMAL LOW (ref 13.0–17.0)
MCH: 30.9 pg (ref 26.0–34.0)
MCHC: 35 g/dL (ref 30.0–36.0)
MCV: 88.4 fL (ref 80.0–100.0)
Platelets: 114 10*3/uL — ABNORMAL LOW (ref 150–400)
RBC: 3.46 MIL/uL — ABNORMAL LOW (ref 4.22–5.81)
RDW: 14.3 % (ref 11.5–15.5)
WBC: 4.7 10*3/uL (ref 4.0–10.5)
nRBC: 0 % (ref 0.0–0.2)

## 2022-11-28 LAB — BASIC METABOLIC PANEL
Anion gap: 5 (ref 5–15)
BUN: 41 mg/dL — ABNORMAL HIGH (ref 8–23)
CO2: 18 mmol/L — ABNORMAL LOW (ref 22–32)
Calcium: 7.8 mg/dL — ABNORMAL LOW (ref 8.9–10.3)
Chloride: 110 mmol/L (ref 98–111)
Creatinine, Ser: 1.33 mg/dL — ABNORMAL HIGH (ref 0.61–1.24)
GFR, Estimated: 50 mL/min — ABNORMAL LOW (ref >60)
Glucose, Bld: 108 mg/dL — ABNORMAL HIGH (ref 70–99)
Potassium: 3.7 mmol/L (ref 3.5–5.1)
Sodium: 133 mmol/L — ABNORMAL LOW (ref 135–145)

## 2022-11-28 LAB — MAGNESIUM: Magnesium: 1.7 mg/dL (ref 1.7–2.4)

## 2022-11-28 LAB — PHOSPHORUS: Phosphorus: 2.5 mg/dL (ref 2.5–4.6)

## 2022-11-28 MED ORDER — TAMSULOSIN HCL 0.4 MG PO CAPS
0.4000 mg | ORAL_CAPSULE | Freq: Every day | ORAL | Status: DC
  Administered 2022-11-28 – 2022-11-29 (×2): 0.4 mg via ORAL
  Filled 2022-11-28 (×2): qty 1

## 2022-11-28 MED ORDER — ALENDRONATE SODIUM 70 MG PO TABS
70.0000 mg | ORAL_TABLET | ORAL | Status: DC
  Filled 2022-11-28: qty 1

## 2022-11-28 NOTE — Progress Notes (Signed)
PROGRESS NOTE    Steve Mcgrath  ZOX:[id number] DOB: May 29, 1927 DOA: 11/22/2022 PCP: Andreas Blower., MD    Brief Narrative:  89 year old gentleman with history of coronary artery disease, type 2 diabetes presented to the emergency room on 4/23 with right-sided vision loss upon awakening several days ago, blurry vision and central dark spot.  He was seen by ophthalmologist and sent to ER where he was diagnosed with TIA.  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 4/26, underwent angiography and balloon angioplasty of the right innominate artery with improvement in flow in the right subclavian and right common carotid. 4/26 evening developed dizziness and nausea, bleeding from the catheter access site.  Blood pressure dropped, he required Levophed and ICU stay.  He received 2 units of PRBC.  Ultimately stabilized.  Now on antihypertensives.  Treated for hemorrhagic shock in ICU.  4/23 admitted 4/25 ongoing intermittent vision symptoms.  4/26 NIR arteriograms of innominate, R subclavian, and R carotid, s/p balloon angioplasty of innominate artery stenosis from R femoral access site. Extubated post-op. Right groin bleeding, heparin discontinued.  2 units of PRBC.  Blood pressure stabilized.  Transiently on Cleviprex and now on oral antihypertensives.  Assessment & Plan:   Acute blood loss anemia and hemorrhagic shock due to bleeding from femoral cannulation site: Transiently on Levophed.  Now hypertensive. Received 2 units emergent blood transfusion and responded appropriately.  Hemoglobin has remained stable. Patient was on heparin infusion postprocedure that is discontinued.  Now back on aspirin and Plavix. Followed by radiology.  Right central retinal artery obstruction: CT head, normal MRI brain, normal CT angiogram of the head and neck, no large vessel occlusion.  Severe stenosis of  the brachiocephalic artery. Cerebral angiogram with severe segmental stenosis of the innominate artery proximally at its origin. 2D echocardiogram with normal ejection fraction. LDL 102. Hemoglobin A1c 6.  Antiplatelet therapy, aspirin 81 mg before admission.  Now on aspirin and Plavix to continue indefinite. Patient on increasing dose of atorvastatin to 80 mg daily. Blood pressures were elevated, now stable on amlodipine 10 mg daily, carvedilol 6.25 mg twice daily.  Not needing any as needed medications. Right eye remains blurry after the procedure.  Coronary artery disease: Stable.  Patient is stabilizing.  He can move out of ICU.  Continue to work with PT OT.  Anticipate discharge when cleared by neurology and IR.   DVT prophylaxis: Aspirin and Plavix   Code Status: Full code Family Communication: None at the bedside Disposition Plan: Status is: Inpatient Remains inpatient appropriate because: Immediate postop, ICU transfer out     Consultants:  Critical care Neurology IR  Procedures:  Balloon angioplasty with residual stenosis, innominate artery.  Antimicrobials:  None   Subjective: Patient seen and examined.  No overnight events.  Patient is frustrated due to initial delay procedure and ultimately persistent vision loss.  He wants to start using his Flomax.  On morning rounds, there were no family at the bedside.  Objective: Vitals:   11/28/22 0900 11/28/22 1000 11/28/22 1100 11/28/22 1200  BP: (!) 130/53 (!) 116/49 (!) 101/47 (!) 103/48  Pulse: 60 (!) 56 (!) 55 (!) 57  Resp: (!) 23 17 20 17   Temp:    97.8 F (36.6 C)  TempSrc:    Oral  SpO2: 94% 95% 95% 93%  Weight:      Height:  Intake/Output Summary (Last 24 hours) at 11/28/2022 1350 Last data filed at 11/28/2022 1200 Gross per 24 hour  Intake 1010 ml  Output 2000 ml  Net -990 ml   Filed Weights   11/22/22 1555  Weight: 62.6 kg    Examination:  General exam: Appears calm and comfortable  at rest. Appropriately anxious and frustrated. Right eye, able to see fingers wiggle, unable to count.  Blurry vision.  Extraocular muscles intact. Respiratory system: Clear to auscultation. Respiratory effort normal. Cardiovascular system: S1 & S2 heard, RRR.  Gastrointestinal system: Abdomen is nondistended, soft and nontender. No organomegaly or masses felt. Normal bowel sounds heard. Central nervous system: Alert and oriented. No focal neurological deficits. Extremities: Symmetric 5 x 5 power. Skin:  Ecchymosis and bruising around the right groin, marked with a pencil.  No subcutaneous collection or bleeding.   Data Reviewed: I have personally reviewed following labs and imaging studies  CBC: Recent Labs  Lab 11/22/22 1601 11/26/22 0547 11/26/22 2311 11/27/22 0512 11/28/22 0450  WBC 6.6 6.2 10.7* 5.1 4.7  NEUTROABS 3.9  --   --  4.7  --   HGB 11.8* 12.2* 10.1* 12.4* 10.7*  HCT 35.2* 36.0* 31.0* 36.7* 30.6*  MCV 90.0 89.3 92.0 89.5 88.4  PLT 175 173 194 154 114*   Basic Metabolic Panel: Recent Labs  Lab 11/24/22 0321 11/26/22 0547 11/26/22 2311 11/27/22 0512 11/28/22 0450  NA 137 137 137 136 133*  K 4.0 4.2 3.7 4.0 3.7  CL 104 107 112* 112* 110  CO2 22 22 18* 17* 18*  GLUCOSE 99 96 160* 123* 108*  BUN 27* 33* 32* 31* 41*  CREATININE 1.42* 1.48* 1.33* 1.30* 1.33*  CALCIUM 9.2 8.7* 7.6* 8.6* 7.8*  MG  --   --   --   --  1.7  PHOS  --   --   --   --  2.5   GFR: Estimated Creatinine Clearance: 29.1 mL/min (A) (by C-G formula based on SCr of 1.33 mg/dL (H)). Liver Function Tests: Recent Labs  Lab 11/22/22 1601  AST 18  ALT 14  ALKPHOS 64  BILITOT 0.4  PROT 7.0  ALBUMIN 3.9   No results for input(s): "LIPASE", "AMYLASE" in the last 168 hours. No results for input(s): "AMMONIA" in the last 168 hours. Coagulation Profile: Recent Labs  Lab 11/22/22 1617 11/27/22 0518  INR 1.1 1.3*   Cardiac Enzymes: No results for input(s): "CKTOTAL", "CKMB",  "CKMBINDEX", "TROPONINI" in the last 168 hours. BNP (last 3 results) No results for input(s): "PROBNP" in the last 8760 hours. HbA1C: No results for input(s): "HGBA1C" in the last 72 hours. CBG: Recent Labs  Lab 11/25/22 0958 11/26/22 0749 11/26/22 1624 11/26/22 1847  GLUCAP 109* 104* 98 139*   Lipid Profile: No results for input(s): "CHOL", "HDL", "LDLCALC", "TRIG", "CHOLHDL", "LDLDIRECT" in the last 72 hours. Thyroid Function Tests: No results for input(s): "TSH", "T4TOTAL", "FREET4", "T3FREE", "THYROIDAB" in the last 72 hours. Anemia Panel: No results for input(s): "VITAMINB12", "FOLATE", "FERRITIN", "TIBC", "IRON", "RETICCTPCT" in the last 72 hours. Sepsis Labs: Recent Labs  Lab 11/27/22 0235  LATICACIDVEN 0.9    No results found for this or any previous visit (from the past 240 hour(s)).       Radiology Studies: No results found.      Scheduled Meds:  alendronate  70 mg Oral Weekly   amLODipine  10 mg Oral Daily   aspirin  81 mg Oral Daily   Or   aspirin  81 mg Per Tube Daily   atorvastatin  80 mg Oral QHS   carvedilol  6.25 mg Oral BID WC   Chlorhexidine Gluconate Cloth  6 each Topical Q0600   clopidogrel  75 mg Oral Daily   Or   clopidogrel  75 mg Per Tube Daily   levothyroxine  100 mcg Oral Q0600   melatonin  3 mg Oral QHS   oxidized cellulose  1 each Topical Once   pantoprazole  40 mg Oral Daily   tamsulosin  0.4 mg Oral Daily   Continuous Infusions:  sodium chloride Stopped (11/27/22 0015)   clevidipine Stopped (11/27/22 0901)     LOS: 4 days    Time spent: 40 minutes    Dorcas Carrow, MD [facility] Pager [phone]

## 2022-11-28 NOTE — Progress Notes (Signed)
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 RN at bedside.  Patient is slightly disappointed that his symptoms did not improve much after the procedure.  Right eye blurry vision persistent, reports that he could not sleep well last night because he feels like the whole bed spins when he closes eyes to sleep.   Allergies: Patient has no known allergies.  Medications: Prior to Admission medications   Medication Sig Start Date End Date Taking? Authorizing Provider  alendronate (FOSAMAX) 70 MG tablet Take by mouth. 09/10/21  Yes [provider]  amLODipine (NORVASC) 10 MG tablet Take 1 tablet by mouth daily. 09/27/22 11/04/23 Yes [provider]  aspirin EC 81 MG tablet Take 1 tablet by mouth daily. 08/17/16  Yes [provider]  atorvastatin (LIPITOR) 40 MG tablet Take 1 tablet by mouth at bedtime. 08/29/18  Yes [provider]  calcium carbonate (SUPER CALCIUM) 1500 (600 Ca) MG TABS tablet Take by mouth daily. 01/13/22  Yes [provider]  cholecalciferol (VITAMIN D3) 25 MCG (1000 UNIT) tablet Take 1,000 Units by mouth daily. 10/13/18  Yes [provider]  cyanocobalamin (VITAMIN B12) 1000 MCG tablet Take 1,000 mcg by mouth daily. 07/31/21  Yes [provider]  hydrochlorothiazide (MICROZIDE) 12.5 MG capsule Take 12.5 mg by mouth daily. 09/17/14  Yes [provider]  levothyroxine (SYNTHROID) 100 MCG tablet Take 100 mcg by mouth daily before breakfast. 08/05/22  Yes [provider]  omeprazole (PRILOSEC) 40 MG capsule Take 1 capsule by mouth daily. 12/15/21  Yes [provider]  COVID-19 At Home Antigen Test [facility] COVID-19 HOME TEST) KIT Use as directed Patient not taking: Reported on 11/23/2022 08/03/21   Gwenlyn Fudge, [facility]  COVID-19 At Home Antigen Test [facility] COVID-19 HOME TEST) KIT Use as directed Patient not taking: Reported on 11/23/2022 08/03/21   Gwenlyn Fudge, [facility]  molnupiravir EUA (LAGEVRIO) 200 MG CAPS capsule Take 4 capsules by mouth twice daily for 5 days Patient not taking: Reported on 11/23/2022 08/03/21   Cathren Laine, MD     Vital Signs: BP (!) 130/53   Pulse 60   Temp 98 F (36.7 C) (Oral)   Resp (!) 23   Ht 5\' 4"  (1.626 m)   Wt 138 lb (62.6 kg)   SpO2 94%   BMI 23.69 kg/m   Physical Exam Vitals reviewed.  Constitutional:      General: He is not in acute distress.    Appearance: He is not ill-appearing.  HENT:     Head: Normocephalic.  Eyes:     Extraocular Movements: Extraocular movements intact.  Pulmonary:     Effort: Pulmonary effort is normal.  Skin:    General: Skin is warm and dry.     Coloration: Skin is not jaundiced or pale.  Neurological:     Mental Status: He is alert.     Comments: Persistent blurry vision in right eye. Able to discern objects with his right eye, but was unable to read the clock.  Able to read the clock with both eyes open.     Psychiatric:  Mood and Affect: Mood normal.        Behavior: Behavior normal.     Imaging: CT HEAD CODE STROKE WO CONTRAST`  Result Date: 11/25/2022 CLINICAL DATA:  Code stroke.  Neuro deficit, acute, stroke suspected EXAM: CT HEAD WITHOUT CONTRAST TECHNIQUE: Contiguous axial images were obtained from the base of the skull through the vertex without intravenous contrast. RADIATION DOSE REDUCTION: This exam was performed according to the departmental dose-optimization program which includes automated exposure control, adjustment of the mA and/or kV according to patient size and/or use of iterative reconstruction technique. COMPARISON:  CT  head 04/14/2021. FINDINGS: Brain: No evidence of acute large vascular territory infarction, hemorrhage, hydrocephalus, extra-axial collection or mass lesion/mass effect. Vascular: No hyperdense vessel identified. Skull: No acute fracture. Sinuses/Orbits: Right maxillary sinus mucosal thickening. Other: No mastoid effusions. ASPECTS [facility] Stroke Program Early CT Score) total score (0-10 with 10 being normal): 10. IMPRESSION: 1. No evidence of acute intracranial abnormality. 2. ASPECTS is 10. Code stroke imaging results were communicated on 11/25/2022 at 10:25 am to provider Dr. Pearlean Brownie via secure text paging. Electronically Signed   By: Feliberto Harts M.D.   On: 11/25/2022 10:26   ECHOCARDIOGRAM COMPLETE  Result Date: 11/24/2022    ECHOCARDIOGRAM REPORT   Patient Name:   Steve Mcgrath Date of Exam: 11/24/2022 Medical Rec #:  [id number]     Height:       64.0 in Accession #:    [id number]    Weight:       138.0 lb Date of Birth:  [birth year]    BSA:          1.671 m Patient Age:    89 years      BP:           128/45 mmHg Patient Gender: M             HR:           60 bpm. Exam Location:  Inpatient Procedure: 2D Echo, Color Doppler and Cardiac Doppler REPORT CONTAINS CRITICAL RESULT Indications:    Stroke  History:        Patient has no prior history of Echocardiogram examinations. CAD                 and Previous Myocardial Infarction, Prior CABG, TIA,                 Signs/Symptoms:Murmur; Risk Factors:Diabetes.  Sonographer:    Wallie Char Referring Phys: Lennox Solders [place] IMPRESSIONS  1. Left ventricular ejection fraction, by estimation, is 60 to 65%. The left ventricle has normal function. The left ventricle has no regional wall motion abnormalities. There is mild left ventricular hypertrophy of the basal-septal segment. Left ventricular diastolic parameters are indeterminate. Elevated left ventricular end-diastolic pressure.  2. Right ventricular systolic function is normal. The right ventricular size  is mildly enlarged. There is mildly elevated pulmonary artery systolic pressure. The estimated right ventricular systolic pressure is 36.3 mmHg.  3. Right atrial size was mildly dilated.  4. The mitral valve is normal in structure. Trivial mitral valve regurgitation. No evidence of mitral stenosis.  5. Tricuspid valve regurgitation is moderate.  6. The aortic valve number of cusps is indeterminant. There is moderate calcification of the aortic valve. There is moderate thickening of the aortic valve. Aortic valve regurgitation is moderate. Moderate to severe aortic valve stenosis. Aortic regurgitation PHT measures 442 to 546 msec. Aortic valve area, by VTI measures 0.81 cm. Aortic  valve mean gradient measures 34.0 mmHg. Aortic valve Vmax measures 4.17 m/s. DVI 0.40. AVA is underestimated due to small LVOT diameter. Vmax increased due to  increaesd flow from significant AI.  7. The inferior vena cava is normal in size with <50% respiratory variability, suggesting right atrial pressure of 8 mmHg. Conclusion(s)/Recommendation(s): No intracardiac source of embolism detected on this transthoracic study. Consider a transesophageal echocardiogram to exclude cardiac source of embolism if clinically indicated. FINDINGS  Left Ventricle: Left ventricular ejection fraction, by estimation, is 60 to 65%. The left ventricle has normal function. The left ventricle has no regional wall motion abnormalities. The left ventricular internal cavity size was normal in size. There is  mild left ventricular hypertrophy of the basal-septal segment. Left ventricular diastolic parameters are indeterminate. Elevated left ventricular end-diastolic pressure. Right Ventricle: The right ventricular size is mildly enlarged. No increase in right ventricular wall thickness. Right ventricular systolic function is normal. There is mildly elevated pulmonary artery systolic pressure. The tricuspid regurgitant velocity is 2.66 m/s, and with an assumed  right atrial pressure of 8 mmHg, the estimated right ventricular systolic pressure is 36.3 mmHg. Left Atrium: Left atrial size was normal in size. Right Atrium: Right atrial size was mildly dilated. Pericardium: There is no evidence of pericardial effusion. Mitral Valve: The mitral valve is normal in structure. Trivial mitral valve regurgitation. No evidence of mitral valve stenosis. MV peak gradient, 2.1 mmHg. The mean mitral valve gradient is 1.0 mmHg. Tricuspid Valve: The tricuspid valve is normal in structure. Tricuspid valve regurgitation is moderate . No evidence of tricuspid stenosis. Aortic Valve: AVA is underestimated due to small LVOT diameter. Vmax increased due to increaesd flow from significant AI. The aortic valve has an indeterminant number of cusps. There is moderate calcification of the aortic valve. There is moderate thickening of the aortic valve. Aortic valve regurgitation is moderate. Aortic regurgitation PHT measures 546 msec. Moderate aortic stenosis is present. Aortic valve mean gradient measures 34.0 mmHg. Aortic valve peak gradient measures 69.6 mmHg. Aortic valve area, by VTI measures 0.81 cm. Pulmonic Valve: The pulmonic valve was normal in structure. Pulmonic valve regurgitation is mild. No evidence of pulmonic stenosis. Aorta: The aortic root is normal in size and structure. Venous: The inferior vena cava is normal in size with less than 50% respiratory variability, suggesting right atrial pressure of 8 mmHg. IAS/Shunts: No atrial level shunt detected by color flow Doppler.  LEFT VENTRICLE PLAX 2D LVIDd:         4.10 cm     Diastology LVIDs:         2.70 cm     LV e' medial:    3.71 cm/s LV PW:         0.90 cm     LV E/e' medial:  20.6 LV IVS:        1.20 cm     LV e' lateral:   8.90 cm/s LVOT diam:     1.50 cm     LV E/e' lateral: 8.6 LV SV:         64 LV SV Index:   39 LVOT Area:     1.77 cm  LV Volumes (MOD) LV vol d, MOD A2C: 93.5 ml LV vol d, MOD A4C: 81.3 ml LV vol s, MOD A2C:  27.7 ml LV vol s, MOD A4C: 30.6 ml LV SV MOD A2C:     65.8 ml LV SV MOD A4C:     81.3 ml LV SV MOD BP:  61.2 ml RIGHT VENTRICLE             IVC RV Basal diam:  4.20 cm     IVC diam: 2.30 cm RV S prime:     12.10 cm/s TAPSE (M-mode): 1.9 cm LEFT ATRIUM             Index        RIGHT ATRIUM           Index LA diam:        3.30 cm 1.97 cm/m   RA Area:     19.30 cm LA Vol (A2C):   29.4 ml 17.59 ml/m  RA Volume:   54.40 ml  32.56 ml/m LA Vol (A4C):   40.6 ml 24.30 ml/m LA Biplane Vol: 34.5 ml 20.65 ml/m  AORTIC VALVE AV Area (Vmax):    0.67 cm AV Area (Vmean):   0.71 cm AV Area (VTI):     0.81 cm AV Vmax:           417.00 cm/s AV Vmean:          269.000 cm/s AV VTI:            0.792 m AV Peak Grad:      69.6 mmHg AV Mean Grad:      34.0 mmHg LVOT Vmax:         158.50 cm/s LVOT Vmean:        108.000 cm/s LVOT VTI:          0.364 m LVOT/AV VTI ratio: 0.46 AI PHT:            546 msec AR Vena Contracta: 0.40 cm  AORTA Ao Root diam: 3.40 cm MITRAL VALVE               TRICUSPID VALVE MV Area (PHT): 3.17 cm    TR Peak grad:   28.3 mmHg MV Area VTI:   2.51 cm    TR Mean grad:   16.0 mmHg MV Peak grad:  2.1 mmHg    TR Vmax:        266.00 cm/s MV Mean grad:  1.0 mmHg    TR Vmean:       191.0 cm/s MV Vmax:       0.72 m/s MV Vmean:      42.0 cm/s   SHUNTS MV Decel Time: 239 msec    Systemic VTI:  0.36 m MV E velocity: 76.60 cm/s  Systemic Diam: 1.50 cm MV A velocity: 70.10 cm/s MV E/A ratio:  1.09 Armanda Magic MD Electronically signed by Armanda Magic MD Signature Date/Time: 11/24/2022/2:02:43 PM    Final     Labs:  CBC: Recent Labs    11/26/22 0547 11/26/22 2311 11/27/22 0512 11/28/22 0450  WBC 6.2 10.7* 5.1 4.7  HGB 12.2* 10.1* 12.4* 10.7*  HCT 36.0* 31.0* 36.7* 30.6*  PLT 173 194 154 114*    COAGS: Recent Labs    11/22/22 1617 11/27/22 0518  INR 1.1 1.3*  APTT 32 31    BMP: Recent Labs    11/26/22 0547 11/26/22 2311 11/27/22 0512 11/28/22 0450  NA 137 137 136 133*  K 4.2 3.7 4.0 3.7   CL 107 112* 112* 110  CO2 22 18* 17* 18*  GLUCOSE 96 160* 123* 108*  BUN 33* 32* 31* 41*  CALCIUM 8.7* 7.6* 8.6* 7.8*  CREATININE 1.48* 1.33* 1.30* 1.33*  GFRNONAA 44* 50* 51* 50*    LIVER FUNCTION TESTS: Recent Labs  11/22/22 1601  BILITOT 0.4  AST 18  ALT 14  ALKPHOS 64  PROT 7.0  ALBUMIN 3.9    Assessment and Plan:  89 y.o. 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 Corliss Skains.   Hospital course complicated by bleeding from R CFA puncture site on 4/26, 30 min of manual pressure held, received 2 units of blood and he was transferred to ICU on 4/27.   Today patient w/o active bleeding from R CFA, small ecchymosis, site is soft, mild tenderness but no appreciable pseudoaneurysm. Right feet with good perfusion. DP could not be palpable at baseline.   Patient off clevidipine, BP stable.  OK to transfer out of ICU today.   Further treatment plan per stroke team/ [facility] Appreciate and agree with the plan.  NIR to follow.  Patient will be scheduled for follow up visit 2-3 weeks after d/c, order in.    Electronically Signed: Willette Brace, PA-C 11/28/2022, 9:14 AM   I spent a total of 15 Minutes at the the patient's bedside AND on the patient's hospital floor or unit, greater than 50% of which was counseling/coordinating care for innominate artery stenosis s/p angioplasty.   This chart was dictated using voice recognition software.  Despite best efforts to proofread,  errors can occur which can change the documentation meaning.

## 2022-11-28 NOTE — Evaluation (Signed)
Physical Therapy Evaluation Patient Details Name: Steve Mcgrath MRN: [id number] DOB: 12-Mar-1927 Today's Date: 11/28/2022  History of Present Illness  The pt is a 89 yo male presenting 4/22 due to acute onset R vision changes. 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 Hospitalization complicated by bleeding from groin site on 4/26PMH includes: CAD s/p CABG, prior MI, HTN, DM II, diabetic polyneuropathy, HLD, hypothyroidism, and CKD III.   Clinical Impression  Pt in bed upon arrival of PT, agreeable to evaluation at this time. 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 The pt now presents with minor limitations in dynamic stability, but was able to complete hallway ambulation with no UE support and minG for safety. Will continue to benefit from skilled PT acutely for further balance assessment and challenge, but anticipate he will be safe to return home with family support and no follow up therapies.   Dynamic Gait Index (DGI): 18/24 (<19 indicates increased risk for falls)    Recommendations for follow up therapy are one component of a multi-disciplinary discharge planning process, led by the attending physician.  Recommendations may be updated based on patient status, additional functional criteria and insurance authorization.  Follow Up Recommendations       Assistance Recommended at Discharge Intermittent Supervision/Assistance  Patient can return home with the following  A little help with walking and/or transfers;A little help with bathing/dressing/bathroom;Assistance with cooking/housework;Assist for transportation;Help with stairs or ramp for entrance    Equipment Recommendations None recommended by PT  Recommendations for Other Services       Functional Status Assessment Patient has had a  recent decline in their functional status and demonstrates the ability to make significant improvements in function in a reasonable and predictable amount of time.     Precautions / Restrictions Precautions Precautions: Fall Restrictions Weight Bearing Restrictions: No      Mobility  Bed Mobility               General bed mobility comments: pt OOB in recliner at start and end of session    Transfers Overall transfer level: Needs assistance Equipment used: None Transfers: Sit to/from Stand Sit to Stand: Supervision           General transfer comment: no assist, no overt LOB. VSS    Ambulation/Gait Ambulation/Gait assistance: Min guard Gait Distance (Feet): 200 Feet Assistive device: None Gait Pattern/deviations: WFL(Within Functional Limits)   Gait velocity interpretation: 1.31 - 2.62 ft/sec, indicative of limited community ambulator   General Gait Details: no overt LOB, slight increase in sway with addition of challenge. pt reports slowed compared to his baseline  Stairs Stairs: Yes Stairs assistance: Min guard Stair Management: One rail Right, Alternating pattern, Forwards Number of Stairs: 12 General stair comments: no assist or LOB, pt reports no pain at groin site. no issues with vision impacting depth perception on stairs  Wheelchair Mobility    Modified Rankin (Stroke Patients Only) Modified Rankin (Stroke Patients Only) Pre-Morbid Rankin Score: No symptoms Modified Rankin: Moderately severe disability     Balance Overall balance assessment: Mild deficits observed, not formally tested                               Standardized Balance Assessment Standardized Balance Assessment : Dynamic Gait  Index   Dynamic Gait Index Level Surface: Normal Change in Gait Speed: Mild Impairment Gait with Horizontal Head Turns: Mild Impairment Gait with Vertical Head Turns: Mild Impairment Gait and Pivot Turn: Mild Impairment Step Over  Obstacle: Mild Impairment Step Around Obstacles: Normal Steps: Mild Impairment Total Score: 18       Pertinent Vitals/Pain Pain Assessment Pain Assessment: No/denies pain    Home Living Family/patient expects to be discharged to:: Private residence Living Arrangements: Spouse/significant other Available Help at Discharge: Family;Available 24 hours/day Type of Home: House Home Access: Stairs to enter Entrance Stairs-Rails: None Entrance Stairs-Number of Steps: 1   Home Layout: Laundry or work area in [employer] Equipment: Information systems manager - built in Additional Comments: old cane from father in Social worker    Prior Function Prior Level of Function : Independent/Modified Independent;Driving             Mobility Comments: pt reports no use of DME, still uses power tools, drives tractor, and works as Surveyor, 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 ADLs Comments: independent, pt cooks for himself and wife each day     Hand Dominance   Dominant Hand: Right    Extremity/Trunk Assessment   Upper Extremity Assessment Upper Extremity Assessment: Defer to OT evaluation;Overall [facility] for tasks assessed    Lower Extremity Assessment Lower Extremity Assessment: Overall WFL for tasks assessed    Cervical / Trunk Assessment Cervical / Trunk Assessment: Normal  Communication   Communication: No difficulties  Cognition Arousal/Alertness: Awake/alert Behavior During Therapy: WFL for tasks assessed/performed Overall Cognitive Status: Within Functional Limits for tasks assessed                                 General Comments: at times pt had difficulty hearing instructions, but was able to follow all commands and demo good safety awareness        General Comments General comments (skin integrity, edema, etc.): VSS, groin site stable after activity    Exercises     Assessment/Plan    PT Assessment Patient needs continued PT services  PT Problem List Decreased activity  tolerance;Decreased balance;Decreased mobility;Decreased coordination       PT Treatment Interventions DME instruction;Gait training;Stair training;Functional mobility training;Balance training;Neuromuscular re-education    PT Goals (Current goals can be found in the Care Plan section)  Acute Rehab PT Goals Patient Stated Goal: return home PT Goal Formulation: With patient Time For Goal Achievement: 12/12/22 Potential to Achieve Goals: Good    Frequency Min 4X/week        AM-PAC PT "6 Clicks" Mobility  Outcome Measure Help needed turning from your back to your side while in a flat bed without using bedrails?: None Help needed moving from lying on your back to sitting on the side of a flat bed without using bedrails?: None Help needed moving to and from a bed to a chair (including a wheelchair)?: None Help needed standing up from a chair using your arms (e.g., wheelchair or bedside chair)?: None Help needed to walk in hospital room?: A Little Help needed climbing 3-5 steps with a railing? : A Little 6 Click Score: 22    End of Session Equipment Utilized During Treatment: Gait belt Activity Tolerance: Patient tolerated treatment well Patient left: in chair;with call bell/phone within reach;with chair alarm set;with family/visitor present Nurse Communication: Mobility status PT Visit Diagnosis: Other abnormalities of gait and mobility (R26.89)    Time: 2831-5176 PT Time Calculation (  min) (ACUTE ONLY): 28 min   Charges:   PT Evaluation $PT Eval Low Complexity: 1 Low PT Treatments $Gait Training: 8-22 mins        Vickki Muff, PT, DPT   Acute Rehabilitation Department Office [phone] Secure Chat Communication Preferred  Ronnie Derby 11/28/2022, 5:14 PM

## 2022-11-28 NOTE — Progress Notes (Addendum)
STROKE TEAM PROGRESS NOTE   INTERVAL HISTORY His daughter is at the bedside.  Patient is sitting up in chair, in no apparent distress.  Patient reports episodes of dizziness, will check orthostatic vital signs.  Also discussed that it is normal to have some episodes of dizziness when getting up after being in the bed for several days patient was educated to not get up without help and he agreed.  On exam, right vision seems to have improved slightly patient is able to see shapes and colors but unable to count fingers or make out numbers on the clock.  Discussed possibility of Maureen Ralphs syndrome as patient has complained about seeing objects that he knows are not there on top of his decreased vision in that right eye.    Vitals:   11/28/22 1100 11/28/22 1200 11/28/22 1300 11/28/22 1400  BP: (!) 101/47 (!) 103/48 (!) 115/56   Pulse: (!) 55 (!) 57 60 64  Resp: 20 17 18 16   Temp:  97.8 F (36.6 C)    TempSrc:  Oral    SpO2: 95% 93% 95% 94%  Weight:      Height:       CBC:  Recent Labs  Lab 11/22/22 1601 11/26/22 0547 11/27/22 0512 11/28/22 0450  WBC 6.6   < > 5.1 4.7  NEUTROABS 3.9  --  4.7  --   HGB 11.8*   < > 12.4* 10.7*  HCT 35.2*   < > 36.7* 30.6*  MCV 90.0   < > 89.5 88.4  PLT 175   < > 154 114*   < > = values in this interval not displayed.    Basic Metabolic Panel:  Recent Labs  Lab 11/27/22 0512 11/28/22 0450  NA 136 133*  K 4.0 3.7  CL 112* 110  CO2 17* 18*  GLUCOSE 123* 108*  BUN 31* 41*  CREATININE 1.30* 1.33*  CALCIUM 8.6* 7.8*  MG  --  1.7  PHOS  --  2.5    Lipid Panel:  Recent Labs  Lab 11/24/22 0321  CHOL 166  TRIG 75  HDL 49  CHOLHDL 3.4  VLDL 15  LDLCALC 829*    HgbA1c:  Recent Labs  Lab 11/24/22 0321  HGBA1C 6.0*    Urine Drug Screen: No results for input(s): "LABOPIA", "COCAINSCRNUR", "LABBENZ", "AMPHETMU", "THCU", "LABBARB" in the last 168 hours.  Alcohol Level  Recent Labs  Lab 11/22/22 1713  ETH <10     IMAGING  past 24 hours No results found.  PHYSICAL EXAM  Temp:  [97.4 F (36.3 C)-98.6 F (37 C)] 97.8 F (36.6 C) (04/28 1200) Pulse Rate:  [52-71] 64 (04/28 1400) Resp:  [16-23] 16 (04/28 1400) BP: (101-143)/(45-61) 115/56 (04/28 1300) SpO2:  [90 %-97 %] 94 % (04/28 1400) Arterial Line BP: (126-184)/(43-58) 155/47 (04/28 0601)  General - Well nourished, well developed, in no apparent distress. Cardiovascular - Regular rhythm and rate.  Harsh ejection systolic murmur heard throughout precordium and in the neck  Mental Status -  Level of arousal and orientation to time, place, and person were intact. Language including expression, naming, repetition, comprehension was assessed and found intact.  Attention span normal , memory, foundation of knowledge intact.  Cranial Nerves II - XII - II - Visual field intact OU.  Subjective blurred vision in the right eye, able to see fingers wiggle but unable to count fingers or see numbers on the clock.  Reported seeing some objects that he knows were not  really there last night to daughter, ? Charles Bonnett syndrome III, IV, VI - Extraocular movements intact. V - Facial sensation intact bilaterally. VII - Facial movement intact bilaterally. VIII - Hearing & vestibular intact bilaterally. X - Palate elevates symmetrically. XI - Chin turning & shoulder shrug intact bilaterally. XII - Tongue protrusion intact.  Motor Strength - The patient's strength was normal in all extremities and pronator drift was absent.  Bulk and tone normal. Sensory - Light touch symmetrical.   Coordination - The patient had normal movements in the hands and feet with no ataxia or dysmetria.  Tremor was absent.  Gait and Station - deferred.  ASSESSMENT/PLAN [name] is a 89 y.o. male with history of ignificant of CAD with CABG, IIDM, CKD stage II, hypothyroidism, presented with transient vision loss.   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  CTA head & neck - no LVO.  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  Heparin IV stopped 4/26. Therapy recommendations: none Disposition: Pending  Right groin hemorrhage Postprocedure hemorrhage, right groin improved, no tenderness or increased ecchymosis. Anemia 12.2->10.1->12.4->10.7 Heparin IV stopped 4/27 Status post 2 unit PRBC 4/26 Off pressors Continue monitoring  Hypertension Home meds: Norvasc 10 mg, HCTZ 12.5 mg BP goal normotensive. Avoid hypotension.   Hyperlipidemia Home meds: Atorvastatin 40, resumed in hospital LDL 102, goal < 70 Increased atorvastatin to 80 mg Continue statin at discharge  Other Stroke Risk Factors Advanced Age >/= 51  Coronary artery disease status post CABG  Other Active Problems Hypothyroidism  Neurology will sign off. Please call with questions. Pt will follow up with stroke clinic NP at [facility] in about 4 weeks. Thanks for the consult.  Marvel Plan, MD PhD Stroke Neurology 11/28/2022 7:52 PM     To contact Stroke Continuity provider, please refer to [email or web address]. After hours, contact General Neurology

## 2022-11-29 DIAGNOSIS — G459 Transient cerebral ischemic attack, unspecified: Secondary | ICD-10-CM | POA: Diagnosis not present

## 2022-11-29 MED ORDER — CARVEDILOL 6.25 MG PO TABS: 6.2500 mg | ORAL_TABLET | Freq: Two times a day (BID) | ORAL | 0 refills | Status: AC

## 2022-11-29 MED ORDER — CLOPIDOGREL BISULFATE 75 MG PO TABS: 75.0000 mg | ORAL_TABLET | Freq: Every day | ORAL | 0 refills | Status: AC

## 2022-11-29 MED ORDER — TAMSULOSIN HCL 0.4 MG PO CAPS: 0.4000 mg | ORAL_CAPSULE | Freq: Every day | ORAL | Status: AC

## 2022-11-29 MED ORDER — ATORVASTATIN CALCIUM 80 MG PO TABS: 80.0000 mg | ORAL_TABLET | Freq: Every day | ORAL | 2 refills | Status: AC

## 2022-11-29 NOTE — Discharge Summary (Signed)
Physician Discharge Summary  Steve Mcgrath WUJ:[id number] DOB: [birth year] DOA: 11/22/2022  PCP: Andreas Blower., MD  Admit date: 11/22/2022 Discharge date: 11/29/2022  Admitted From: Home Disposition: Home with home health PT  Recommendations for Outpatient Follow-up:  Follow up with PCP in 1-2 weeks Please obtain BMP/CBC in one week Neurology and radiology will schedule follow-up  Home Health: Physical therapy Equipment/Devices: None needed  Discharge Condition: Stable CODE STATUS: Full code Diet recommendation: Low-salt diet  Discharge summary: 89 year old gentleman with history of coronary artery disease, type 2 diabetes presented to the emergency room on 4/23 with right-sided vision loss upon awakening several days ago, blurry vision and central dark spot.  He was seen by ophthalmologist and sent to ER where he was diagnosed with TIA.  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 4/26, underwent angiography and balloon angioplasty of the right innominate artery with improvement in flow in the right subclavian and right common carotid. 4/26 evening developed dizziness and nausea, bleeding from the catheter access site.  Blood pressure dropped, he required Levophed and ICU stay.  He received 2 units of PRBC.  Ultimately stabilized.  Now on antihypertensives.  Treated for hemorrhagic shock in ICU.   4/23 admitted 4/25 ongoing intermittent vision symptoms.  4/26 NIR arteriograms of innominate, R subclavian, and R carotid, s/p balloon angioplasty of innominate artery stenosis from R femoral access site. Extubated post-op. Right groin bleeding, heparin discontinued.  2 units of PRBC.  Blood pressure stabilized.  Transiently on Cleviprex and now on oral antihypertensives. Good clinical recovery.  Going home today.   # Acute blood loss anemia and hemorrhagic shock due to bleeding from  femoral cannulation site: Transiently on Levophed.  Improved.  Now on antihypertensives. Received 2 units emergent blood transfusion and responded appropriately.  Hemoglobin has remained stable. Now back on aspirin and Plavix and tolerating well.  # Right central retinal artery obstruction: TIA. CT head, normal MRI brain, normal CT angiogram of the head and neck, no large vessel occlusion.  Severe stenosis of the brachiocephalic artery. Cerebral angiogram with severe segmental stenosis of the innominate artery proximally at its origin. 2D echocardiogram with normal ejection fraction. LDL 102. Hemoglobin A1c 6.   Plan: Antiplatelet therapy, aspirin 81 mg before admission.  Now on aspirin and Plavix to continue indefinite. Patient on increasing dose of atorvastatin to 80 mg daily. Blood pressures were elevated, now stable on amlodipine 10 mg daily, carvedilol 6.25 mg twice daily.  Right eye remains blurry after the procedure. Neurology and interventional radiology will schedule follow-up.   Coronary artery disease: Stable.   Patient with adequate clinical improvement today.  He is going home.  His mobility has improved.  He has some trouble with complex mobility and will benefit with home health PT.  Discharge Diagnoses:  Principal Problem:   TIA (transient ischemic attack) Active Problems:   Atherosclerotic stenosis of innominate artery   Hemorrhagic shock (HCC)   ABLA (acute blood loss anemia)    Discharge Instructions  Discharge Instructions     Ambulatory referral to Neurology   Complete by: As directed    Follow up with stroke clinic NP (Jessica Vanschaick or Darrol Angel, if both not available, consider Manson Allan, or Ahern) at [facility] in about 4 weeks. Thanks.   Call MD for:  redness, tenderness, or signs of infection (pain, swelling, redness, odor or green/yellow discharge around incision site)  Complete by: As directed    Diet - low sodium heart healthy    Complete by: As directed    Increase activity slowly   Complete by: As directed       Allergies as of 11/29/2022   No Known Allergies      Medication List     STOP taking these medications    Carestart COVID-19 Home Test Kit Generic drug: COVID-19 At Home Antigen Test   hydrochlorothiazide 12.5 MG capsule Commonly known as: MICROZIDE   molnupiravir EUA 200 MG Caps capsule Commonly known as: LAGEVRIO       TAKE these medications    alendronate 70 MG tablet Commonly known as: FOSAMAX Take by mouth.   amLODipine 10 MG tablet Commonly known as: NORVASC Take 1 tablet by mouth daily.   aspirin EC 81 MG tablet Take 1 tablet by mouth daily.   atorvastatin 80 MG tablet Commonly known as: LIPITOR Take 1 tablet (80 mg total) by mouth at bedtime. What changed:  medication strength how much to take   carvedilol 6.25 MG tablet Commonly known as: COREG Take 1 tablet (6.25 mg total) by mouth 2 (two) times daily with a meal.   cholecalciferol 25 MCG (1000 UNIT) tablet Commonly known as: VITAMIN D3 Take 1,000 Units by mouth daily.   clopidogrel 75 MG tablet Commonly known as: PLAVIX Take 1 tablet (75 mg total) by mouth daily. Start taking on: November 30, 2022   cyanocobalamin 1000 MCG tablet Commonly known as: VITAMIN B12 Take 1,000 mcg by mouth daily.   levothyroxine 100 MCG tablet Commonly known as: SYNTHROID Take 100 mcg by mouth daily before breakfast.   omeprazole 40 MG capsule Commonly known as: PRILOSEC Take 1 capsule by mouth daily.   Super Calcium 1500 (600 Ca) MG Tabs tablet Generic drug: calcium carbonate Take by mouth daily.   tamsulosin 0.4 MG Caps capsule Commonly known as: FLOMAX Take 1 capsule (0.4 mg total) by mouth daily. Start taking on: November 30, 2022        Follow-up Information     Julieanne Cotton, MD Follow up.   Specialties: Interventional Radiology, Radiology Why: Follow up visit with Dr. Corliss Skains in 2-3 weeks after  discharge. Our schedulers will call you to set up the appointment. Contact information: [address] [place] [address] [place] [address] [phone]         [facility]. Schedule an appointment as soon as possible for a visit in 1 month(s).   Specialty: Neurology Why: stroke clinic Contact information: [address] [place] [address] [phone]               No Known Allergies  Consultations: Neurology IR Critical care   Procedures/Studies: CT HEAD CODE STROKE WO CONTRAST`  Result Date: 11/25/2022 CLINICAL DATA:  Code stroke.  Neuro deficit, acute, stroke suspected EXAM: CT HEAD WITHOUT CONTRAST TECHNIQUE: Contiguous axial images were obtained from the base of the skull through the vertex without intravenous contrast. RADIATION DOSE REDUCTION: This exam was performed according to the departmental dose-optimization program which includes automated exposure control, adjustment of the mA and/or kV according to patient size and/or use of iterative reconstruction technique. COMPARISON:  CT head 04/14/2021. FINDINGS: Brain: No evidence of acute large vascular territory infarction, hemorrhage, hydrocephalus, extra-axial collection or mass lesion/mass effect. Vascular: No hyperdense vessel identified. Skull: No acute fracture. Sinuses/Orbits: Right maxillary sinus mucosal thickening. Other: No mastoid effusions. ASPECTS [facility] Stroke Program Early CT  Score) total score (0-10 with 10 being normal): 10. IMPRESSION: 1. No evidence of acute intracranial abnormality. 2. ASPECTS is 10. Code stroke imaging results were communicated on 11/25/2022 at 10:25 am to provider Dr. Pearlean Brownie via secure text paging. Electronically Signed   By: Feliberto Harts M.D.   On: 11/25/2022 10:26   ECHOCARDIOGRAM COMPLETE  Result Date: 11/24/2022    ECHOCARDIOGRAM REPORT   Patient Name:   KELVYN SCHUNK Date of Exam: 11/24/2022 Medical Rec #:  [id number]      Height:       64.0 in Accession #:    [id number]    Weight:       138.0 lb Date of Birth:  15-Jul-1927    BSA:          1.671 m Patient Age:    89 years      BP:           128/45 mmHg Patient Gender: M             HR:           60 bpm. Exam Location:  Inpatient Procedure: 2D Echo, Color Doppler and Cardiac Doppler REPORT CONTAINS CRITICAL RESULT Indications:    Stroke  History:        Patient has no prior history of Echocardiogram examinations. CAD                 and Previous Myocardial Infarction, Prior CABG, TIA,                 Signs/Symptoms:Murmur; Risk Factors:Diabetes.  Sonographer:    Wallie Char Referring Phys: Lennox Solders [place] IMPRESSIONS  1. Left ventricular ejection fraction, by estimation, is 60 to 65%. The left ventricle has normal function. The left ventricle has no regional wall motion abnormalities. There is mild left ventricular hypertrophy of the basal-septal segment. Left ventricular diastolic parameters are indeterminate. Elevated left ventricular end-diastolic pressure.  2. Right ventricular systolic function is normal. The right ventricular size is mildly enlarged. There is mildly elevated pulmonary artery systolic pressure. The estimated right ventricular systolic pressure is 36.3 mmHg.  3. Right atrial size was mildly dilated.  4. The mitral valve is normal in structure. Trivial mitral valve regurgitation. No evidence of mitral stenosis.  5. Tricuspid valve regurgitation is moderate.  6. The aortic valve number of cusps is indeterminant. There is moderate calcification of the aortic valve. There is moderate thickening of the aortic valve. Aortic valve regurgitation is moderate. Moderate to severe aortic valve stenosis. Aortic regurgitation PHT measures 442 to 546 msec. Aortic valve area, by VTI measures 0.81 cm. Aortic valve mean gradient measures 34.0 mmHg. Aortic valve Vmax measures 4.17 m/s. DVI 0.40. AVA is underestimated due to small LVOT diameter. Vmax increased due to   increaesd flow from significant AI.  7. The inferior vena cava is normal in size with <50% respiratory variability, suggesting right atrial pressure of 8 mmHg. Conclusion(s)/Recommendation(s): No intracardiac source of embolism detected on this transthoracic study. Consider a transesophageal echocardiogram to exclude cardiac source of embolism if clinically indicated. FINDINGS  Left Ventricle: Left ventricular ejection fraction, by estimation, is 60 to 65%. The left ventricle has normal function. The left ventricle has no regional wall motion abnormalities. The left ventricular internal cavity size was normal in size. There is  mild left ventricular hypertrophy of the basal-septal segment. Left ventricular diastolic parameters are indeterminate. Elevated left ventricular end-diastolic pressure. Right Ventricle: The right ventricular size is mildly  enlarged. No increase in right ventricular wall thickness. Right ventricular systolic function is normal. There is mildly elevated pulmonary artery systolic pressure. The tricuspid regurgitant velocity is 2.66 m/s, and with an assumed right atrial pressure of 8 mmHg, the estimated right ventricular systolic pressure is 36.3 mmHg. Left Atrium: Left atrial size was normal in size. Right Atrium: Right atrial size was mildly dilated. Pericardium: There is no evidence of pericardial effusion. Mitral Valve: The mitral valve is normal in structure. Trivial mitral valve regurgitation. No evidence of mitral valve stenosis. MV peak gradient, 2.1 mmHg. The mean mitral valve gradient is 1.0 mmHg. Tricuspid Valve: The tricuspid valve is normal in structure. Tricuspid valve regurgitation is moderate . No evidence of tricuspid stenosis. Aortic Valve: AVA is underestimated due to small LVOT diameter. Vmax increased due to increaesd flow from significant AI. The aortic valve has an indeterminant number of cusps. There is moderate calcification of the aortic valve. There is moderate  thickening of the aortic valve. Aortic valve regurgitation is moderate. Aortic regurgitation PHT measures 546 msec. Moderate aortic stenosis is present. Aortic valve mean gradient measures 34.0 mmHg. Aortic valve peak gradient measures 69.6 mmHg. Aortic valve area, by VTI measures 0.81 cm. Pulmonic Valve: The pulmonic valve was normal in structure. Pulmonic valve regurgitation is mild. No evidence of pulmonic stenosis. Aorta: The aortic root is normal in size and structure. Venous: The inferior vena cava is normal in size with less than 50% respiratory variability, suggesting right atrial pressure of 8 mmHg. IAS/Shunts: No atrial level shunt detected by color flow Doppler.  LEFT VENTRICLE PLAX 2D LVIDd:         4.10 cm     Diastology LVIDs:         2.70 cm     LV e' medial:    3.71 cm/s LV PW:         0.90 cm     LV E/e' medial:  20.6 LV IVS:        1.20 cm     LV e' lateral:   8.90 cm/s LVOT diam:     1.50 cm     LV E/e' lateral: 8.6 LV SV:         64 LV SV Index:   39 LVOT Area:     1.77 cm  LV Volumes (MOD) LV vol d, MOD A2C: 93.5 ml LV vol d, MOD A4C: 81.3 ml LV vol s, MOD A2C: 27.7 ml LV vol s, MOD A4C: 30.6 ml LV SV MOD A2C:     65.8 ml LV SV MOD A4C:     81.3 ml LV SV MOD BP:      61.2 ml RIGHT VENTRICLE             IVC RV Basal diam:  4.20 cm     IVC diam: 2.30 cm RV S prime:     12.10 cm/s TAPSE (M-mode): 1.9 cm LEFT ATRIUM             Index        RIGHT ATRIUM           Index LA diam:        3.30 cm 1.97 cm/m   RA Area:     19.30 cm LA Vol (A2C):   29.4 ml 17.59 ml/m  RA Volume:   54.40 ml  32.56 ml/m LA Vol (A4C):   40.6 ml 24.30 ml/m LA Biplane Vol: 34.5 ml 20.65 ml/m  AORTIC VALVE AV Area (  Vmax):    0.67 cm AV Area (Vmean):   0.71 cm AV Area (VTI):     0.81 cm AV Vmax:           417.00 cm/s AV Vmean:          269.000 cm/s AV VTI:            0.792 m AV Peak Grad:      69.6 mmHg AV Mean Grad:      34.0 mmHg LVOT Vmax:         158.50 cm/s LVOT Vmean:        108.000 cm/s LVOT VTI:          0.364  m LVOT/AV VTI ratio: 0.46 AI PHT:            546 msec AR Vena Contracta: 0.40 cm  AORTA Ao Root diam: 3.40 cm MITRAL VALVE               TRICUSPID VALVE MV Area (PHT): 3.17 cm    TR Peak grad:   28.3 mmHg MV Area VTI:   2.51 cm    TR Mean grad:   16.0 mmHg MV Peak grad:  2.1 mmHg    TR Vmax:        266.00 cm/s MV Mean grad:  1.0 mmHg    TR Vmean:       191.0 cm/s MV Vmax:       0.72 m/s MV Vmean:      42.0 cm/s   SHUNTS MV Decel Time: 239 msec    Systemic VTI:  0.36 m MV E velocity: 76.60 cm/s  Systemic Diam: 1.50 cm MV A velocity: 70.10 cm/s MV E/A ratio:  1.09 Armanda Magic MD Electronically signed by Armanda Magic MD Signature Date/Time: 11/24/2022/2:02:43 PM    Final    MR BRAIN WO CONTRAST  Result Date: 11/23/2022 CLINICAL DATA:  Transient ischemic attack (TIA) Transient right eye vision loss. Neurology recommends MRI, ESR/CRP and admit to [facility]. EXAM: MRI HEAD WITHOUT CONTRAST TECHNIQUE: Multiplanar, multiecho pulse sequences of the brain and surrounding structures were obtained without intravenous contrast. COMPARISON:  CT November 22, 2022. FINDINGS: Brain: No acute infarction, hemorrhage, hydrocephalus, extra-axial collection or mass lesion. Very mild for age scattered T2/FLAIR hyperintensities in the white matter, nonspecific but compatible with mild chronic microvascular ischemic change. Vascular: Major arterial flow voids are maintained at the skull base. Skull and upper cervical spine: Normal marrow signal. Sinuses/Orbits: Mild paranasal sinus mucosal thickening. No acute orbital findings. Other: No mastoid effusions. IMPRESSION: No evidence of acute abnormality. Electronically Signed   By: Feliberto Harts M.D.   On: 11/23/2022 16:45   CT ANGIO HEAD NECK W WO CM  Result Date: 11/22/2022 CLINICAL DATA:  Transient vision loss in right eye on Friday EXAM: CT ANGIOGRAPHY HEAD AND NECK WITH AND WITHOUT CONTRAST TECHNIQUE: Multidetector CT imaging of the head and neck was performed using the  standard protocol during bolus administration of intravenous contrast. Multiplanar CT image reconstructions and MIPs were obtained to evaluate the vascular anatomy. Carotid stenosis measurements (when applicable) are obtained utilizing NASCET criteria, using the distal internal carotid diameter as the denominator. RADIATION DOSE REDUCTION: This exam was performed according to the departmental dose-optimization program which includes automated exposure control, adjustment of the mA and/or kV according to patient size and/or use of iterative reconstruction technique. CONTRAST:  75mL OMNIPAQUE IOHEXOL 350 MG/ML SOLN COMPARISON:  No prior CTA available, correlation is made with 04/14/2021 CT head  FINDINGS: CT HEAD FINDINGS Brain: No evidence of acute infarct, hemorrhage, mass, mass effect, or midline shift. No hydrocephalus or extra-axial fluid collection. Periventricular white matter changes, likely the sequela of chronic small vessel ischemic disease. Remote lacunar infarcts in the bilateral basal ganglia. Normal cerebral volume for age. Vascular: No hyperdense vessel. Skull: Negative for fracture or focal lesion. Sinuses/Orbits: Mucosal thickening in the ethmoid air cells. Status post bilateral lens replacements. Other: The mastoid air cells are well aerated. CTA NECK FINDINGS Aortic arch: Standard branching. Imaged portion shows no evidence of aneurysm or dissection. Aortic atherosclerosis. 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 Atherosclerotic plaque at the origin of the left common carotid and left subclavian arteries is not hemodynamically significant. Right carotid system: No evidence of dissection, occlusion, or hemodynamically significant stenosis (greater than 50%). Left carotid system: No evidence of dissection, occlusion, or hemodynamically significant stenosis (greater than 50%). Vertebral arteries: Moderate  stenosis at the origin of the right vertebral artery. No other significant stenosis in the vertebral arteries. No evidence of dissection. Skeleton: No acute osseous abnormality. Degenerative changes in the cervical spine. Other neck: Negative. Upper chest: No focal pulmonary opacity or pleural effusion. Apical pleural-parenchymal scarring. Paraseptal emphysema. Review of the MIP images confirms the above findings CTA HEAD FINDINGS Anterior circulation: Both internal carotid arteries are patent to the termini, with mild stenosis in the bilateral supraclinoid segments. A1 segments patent, somewhat hypoplastic on the right. Normal anterior communicating artery. Anterior cerebral arteries are patent to their distal aspects without significant stenosis. No M1 stenosis or occlusion. MCA branches perfused to their distal aspects without significant stenosis. Posterior circulation: Vertebral arteries patent to the vertebrobasilar junction without significant stenosis. Posterior inferior cerebellar arteries patent proximally. Basilar patent to its distal aspect without significant stenosis. Superior cerebellar arteries patent proximally. Patent right P1. Fetal origin of the left PCA from the left posterior communicating artery. PCAs perfused to their distal aspects without significant stenosis. Venous sinuses: As permitted by contrast timing, patent. Anatomic variants: Fetal origin of the left PCA. Review of the MIP images confirms the above findings IMPRESSION: 1. No acute intracranial process. 2. 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 3. Moderate stenosis at the origin of the right vertebral artery. No other hemodynamically significant stenosis in the neck. 4. No intracranial large vessel occlusion or significant stenosis. 5. Aortic atherosclerosis. 6. Emphysema. Aortic Atherosclerosis (ICD10-I70.0) and Emphysema (ICD10-J43.9). Electronically Signed   By: Wiliam Ke M.D.    On: 11/22/2022 19:12   (Echo, Carotid, EGD, Colonoscopy, ERCP)    Subjective: Patient seen and examined.  Denies any complaints.  Very excited with plans to go home.  Wife at the bedside.   Discharge Exam: Vitals:   11/29/22 0600 11/29/22 0800  BP:  (!) 128/54  Pulse:    Resp: 19   Temp:  97.8 F (36.6 C)  SpO2:  98%   Vitals:   11/29/22 0400 11/29/22 0500 11/29/22 0600 11/29/22 0800  BP:    (!) 128/54  Pulse:      Resp:  20 19   Temp: 98.1 F (36.7 C)   97.8 F (36.6 C)  TempSrc: Oral   Oral  SpO2:    98%  Weight:      Height:        General: Pt is alert, awake, not in acute distress On room air.  Age-appropriate.  Pleasant to conversation. Alert awake and oriented.  No focal neurological deficits. Right eye with blurry vision on the central visual field. Cardiovascular: RRR, S1/S2 +, no rubs, no gallops Respiratory: CTA bilaterally, no wheezing, no rhonchi Abdominal: Soft, NT, ND, bowel sounds + Ecchymosis, receding on the right groin.  No palpable hematoma or swelling.  Distal neurovascular status intact. Extremities: no edema, no cyanosis    The results of significant diagnostics from this hospitalization (including imaging, microbiology, ancillary and laboratory) are listed below for reference.     Microbiology: No results found for this or any previous visit (from the past 240 hour(s)).   Labs: BNP (last 3 results) No results for input(s): "BNP" in the last 8760 hours. Basic Metabolic Panel: Recent Labs  Lab 11/24/22 0321 11/26/22 0547 11/26/22 2311 11/27/22 0512 11/28/22 0450  NA 137 137 137 136 133*  K 4.0 4.2 3.7 4.0 3.7  CL 104 107 112* 112* 110  CO2 22 22 18* 17* 18*  GLUCOSE 99 96 160* 123* 108*  BUN 27* 33* 32* 31* 41*  CREATININE 1.42* 1.48* 1.33* 1.30* 1.33*  CALCIUM 9.2 8.7* 7.6* 8.6* 7.8*  MG  --   --   --   --  1.7  PHOS  --   --   --   --  2.5   Liver Function Tests: Recent Labs  Lab 11/22/22 1601  AST 18  ALT 14  ALKPHOS  64  BILITOT 0.4  PROT 7.0  ALBUMIN 3.9   No results for input(s): "LIPASE", "AMYLASE" in the last 168 hours. No results for input(s): "AMMONIA" in the last 168 hours. CBC: Recent Labs  Lab 11/22/22 1601 11/26/22 0547 11/26/22 2311 11/27/22 0512 11/28/22 0450  WBC 6.6 6.2 10.7* 5.1 4.7  NEUTROABS 3.9  --   --  4.7  --   HGB 11.8* 12.2* 10.1* 12.4* 10.7*  HCT 35.2* 36.0* 31.0* 36.7* 30.6*  MCV 90.0 89.3 92.0 89.5 88.4  PLT 175 173 194 154 114*   Cardiac Enzymes: No results for input(s): "CKTOTAL", "CKMB", "CKMBINDEX", "TROPONINI" in the last 168 hours. BNP: Invalid input(s): "POCBNP" CBG: Recent Labs  Lab 11/25/22 0958 11/26/22 0749 11/26/22 1624 11/26/22 1847  GLUCAP 109* 104* 98 139*   D-Dimer No results for input(s): "DDIMER" in the last 72 hours. Hgb A1c No results for input(s): "HGBA1C" in the last 72 hours. Lipid Profile No results for input(s): "CHOL", "HDL", "LDLCALC", "TRIG", "CHOLHDL", "LDLDIRECT" in the last 72 hours. Thyroid function studies No results for input(s): "TSH", "T4TOTAL", "T3FREE", "THYROIDAB" in the last 72 hours.  Invalid input(s): "FREET3" Anemia work up No results for input(s): "VITAMINB12", "FOLATE", "FERRITIN", "TIBC", "IRON", "RETICCTPCT" in the last 72 hours. Urinalysis No results found for: "COLORURINE", "APPEARANCEUR", "LABSPEC", "PHURINE", "GLUCOSEU", "HGBUR", "BILIRUBINUR", "KETONESUR", "PROTEINUR", "UROBILINOGEN", "NITRITE", "LEUKOCYTESUR" Sepsis Labs Recent Labs  Lab 11/26/22 0547 11/26/22 2311 11/27/22 0512 11/28/22 0450  WBC 6.2 10.7* 5.1 4.7   Microbiology No results found for this or any previous visit (from the past 240 hour(s)).   Time coordinating discharge: 35 minutes  SIGNED:   Dorcas Carrow, MD  [facility] 11/29/2022, 10:08 AM

## 2022-11-29 NOTE — Evaluation (Signed)
Occupational Therapy Evaluation Patient Details Name: Steve Mcgrath MRN: [id number] DOB: [birth year] Today's Date: 11/29/2022   History of Present Illness The pt is a 89 yo male presenting 4/22 due to acute onset R vision changes. 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 Hospitalization complicated by bleeding from groin site on 4/26. PMH includes: CAD s/p CABG, prior MI, HTN, DM II, diabetic polyneuropathy, HLD, hypothyroidism, and CKD III.   Clinical Impression   Pt independent to modified independent for selfcare tasks and transfers at this time.  He reports blurry vision more in the central part of the right eye currently than the lateral.  Peripheral fields and central fields intact without sign of quadrantanopsia or hemianopsia.  He has history of macular degeneration which has affected his left eye more that the right.  With mobility in the hallway he was able to read the room numbers on the right side and he was able to identify and avoid any obstacles on the right or left.  He and his spouse report that the blurriness has improved over the past few days and currently it is not affecting his functional completion of selfcare tasks.  Recommend continuing to monitor it and if it worsens, come back to the ED or re-visit the optometrist.  Discussed outpatient low vision specialist, however pt not really at a point to need them.  No further acute or post acute OT needs at this time.         Recommendations for follow up therapy are one component of a multi-disciplinary discharge planning process, led by the attending physician.  Recommendations may be updated based on patient status, additional functional criteria and insurance authorization.   Assistance Recommended at Discharge PRN  Patient can return home with the following Assist for transportation;Assistance with cooking/housework    Functional Status Assessment  Patient has not had a recent  decline in their functional status  Equipment Recommendations  None recommended by OT       Precautions / Restrictions Precautions Precautions: Fall Restrictions Weight Bearing Restrictions: No      Mobility Bed Mobility                    Transfers Overall transfer level: Modified independent Equipment used: None Transfers: Sit to/from Stand Sit to Stand: Independent           General transfer comment: Pt able to ambulate around the room and in the hallway without use of an assistive device and without LOB.      Balance Overall balance assessment: No apparent balance deficits (not formally assessed)                                         ADL either performed or assessed with clinical judgement   ADL Overall ADL's : Modified independent                                       General ADL Comments: Pt is currently modified independent for simulated selfcare tasks and functional transfers during session.  He was able to donn all of his clothing prior to this session per his and his spouse's report without diffculty.  With mobility he was able to ambulate in the hallway and room without trouble and was able  to identify obstacles on both sides and read room numbers.  Unsure of cause of single right eye blurriness but it doesn't seem to affect him functionally.  Discussed watching it over the next few days and avoid driving, operating power tools, or trying to get on a ladder.  If vision would to worsen, I encouraged return to the optometrist vs ED depending on symptoms.  Pt with no weakness or balance deficits noticed unrelated to advanced age.     Vision Baseline Vision/History: 1 Wears glasses;6 Macular Degeneration Ability to See in Adequate Light: 1 Impaired Patient Visual Report: Blurring of vision;Other (comment) (right eye central blurring) Vision Assessment?: Yes Eye Alignment: Within Functional Limits Ocular Range of Motion:  Within Functional Limits Alignment/Gaze Preference: Within Defined Limits Tracking/Visual Pursuits: Able to track stimulus in all quads without difficulty Convergence: Within functional limits Visual Fields: No apparent deficits Additional Comments: Pt with right eye central burriness with reports of being cloudy in the superior portion and inferior portion as well initially but now improving.  He was able to read large print with his glassess with both eyes but when closing the left eye, he can see better out of the outer portion than central.  He reports the objects moving around slightly but this has been present for a while with his macular degenration.            Pertinent Vitals/Pain Pain Assessment Pain Assessment: No/denies pain        Extremity/Trunk Assessment Upper Extremity Assessment Upper Extremity Assessment: Overall WFL for tasks assessed   Lower Extremity Assessment Lower Extremity Assessment: Defer to PT evaluation   Cervical / Trunk Assessment Cervical / Trunk Assessment: Normal   Communication     Cognition Arousal/Alertness: Awake/alert Behavior During Therapy: WFL for tasks assessed/performed Overall Cognitive Status: Within Functional Limits for tasks assessed                                             OT Problem List: Impaired vision/perception       AM-PAC OT "6 Clicks" Daily Activity     Outcome Measure Help from another person eating meals?: None Help from another person taking care of personal grooming?: None Help from another person toileting, which includes using toliet, bedpan, or urinal?: None Help from another person bathing (including washing, rinsing, drying)?: None Help from another person to put on and taking off regular upper body clothing?: None Help from another person to put on and taking off regular lower body clothing?: None 6 Click Score: 24   End of Session Nurse Communication: Mobility status  Activity  Tolerance: Patient tolerated treatment well Patient left: in chair;with call bell/phone within reach;with family/visitor present                   Time: 4098-1191 OT Time Calculation (min): 36 min Charges:  OT General Charges $OT Visit: 1 Visit OT Evaluation $OT Eval Moderate Complexity: 1 Mod OT Treatments $Therapeutic Activity: 8-22 mins Perrin Maltese, OTR/L Acute Rehabilitation Services  Office [phone] 11/29/2022

## 2022-11-29 NOTE — Progress Notes (Signed)
Physical Therapy Treatment Patient Details Name: Steve Mcgrath MRN: [id number] DOB: 11/05/1926 Today's Date: 11/29/2022   History of Present Illness The pt is a 89 yo male presenting 4/22 due to acute onset R vision changes. 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 Hospitalization complicated by bleeding from groin site on 4/26. PMH includes: CAD s/p CABG, prior MI, HTN, DM II, diabetic polyneuropathy, HLD, hypothyroidism, and CKD III.    PT Comments    Patient eager to discharge home today (MD in just prior to PT and is planning on discharging him). Patient ambulating on level surfaces without challenges without difficulty. However when add challenges (vary speed, step over obstacle, head turns) pt with unsteadiness. Scored 20/24 on DGI today. 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 Patient/family in agreement.     Recommendations for follow up therapy are one component of a multi-disciplinary discharge planning process, led by the attending physician.  Recommendations may be updated based on patient status, additional functional criteria and insurance authorization.  Follow Up Recommendations       Assistance Recommended at Discharge Set up Supervision/Assistance  Patient can return home with the following A little help with walking and/or transfers;A little help with bathing/dressing/bathroom;Assistance with cooking/housework;Assist for transportation;Help with stairs or ramp for entrance   Equipment Recommendations  None recommended by PT    Recommendations for Other Services       Precautions / Restrictions Precautions Precautions: Fall Restrictions Weight Bearing Restrictions: No     Mobility  Bed Mobility               General bed mobility comments: pt OOB in recliner at start and end of session     Transfers Overall transfer level: Needs assistance Equipment used: None Transfers: Sit to/from Stand Sit to Stand: Independent           General transfer comment: no assist, no overt LOB. VSS    Ambulation/Gait Ambulation/Gait assistance: Min guard Gait Distance (Feet): 200 Feet Assistive device: None Gait Pattern/deviations: WFL(Within Functional Limits) Gait velocity: able to vary up/down with good range Gait velocity interpretation: >2.62 ft/sec, indicative of community ambulatory   General Gait Details: no overt LOB, slight increase in sway with addition of challenge. pt reports slightly unsteady compared to his baseline   Social research officer, government Rankin (Stroke Patients Only) Modified Rankin (Stroke Patients Only) Pre-Morbid Rankin Score: No symptoms Modified Rankin: Moderate disability     Balance Overall balance assessment: Mild deficits observed, not formally tested                               Standardized Balance Assessment Standardized Balance Assessment : Dynamic Gait Index   Dynamic Gait Index Level Surface: Normal Change in Gait Speed: Normal Gait with Horizontal Head Turns: Mild Impairment Gait with Vertical Head Turns: Mild Impairment Gait and Pivot Turn: Normal Step Over Obstacle: Mild Impairment Step Around Obstacles: Normal Steps: Mild Impairment Total Score: 20      Cognition Arousal/Alertness: Awake/alert Behavior During Therapy: WFL for tasks assessed/performed Overall Cognitive Status: Within Functional Limits for tasks assessed  Exercises      General Comments General comments (skin integrity, edema, etc.): Patient feels his imbalance is two-fold: decr vision in rt eye and lack of activity while hospitalized      Pertinent Vitals/Pain Pain Assessment Pain Assessment: No/denies pain    Home Living                           Prior Function            PT Goals (current goals can now be found in the care plan section) Acute Rehab PT Goals Patient Stated Goal: return home PT Goal Formulation: With patient Time For Goal Achievement: 12/12/22 Potential to Achieve Goals: Good Progress towards PT goals: Progressing toward goals    Frequency    Min 4X/week      PT Plan Discharge plan needs to be updated    Co-evaluation              AM-PAC PT "6 Clicks" Mobility   Outcome Measure  Help needed turning from your back to your side while in a flat bed without using bedrails?: None Help needed moving from lying on your back to sitting on the side of a flat bed without using bedrails?: None Help needed moving to and from a bed to a chair (including a wheelchair)?: None Help needed standing up from a chair using your arms (e.g., wheelchair or bedside chair)?: None Help needed to walk in hospital room?: A Little Help needed climbing 3-5 steps with a railing? : A Little 6 Click Score: 22    End of Session Equipment Utilized During Treatment: Gait belt Activity Tolerance: Patient tolerated treatment well Patient left: in chair;with call bell/phone within reach;with family/visitor present;with nursing/sitter in room Nurse Communication: Mobility status;Other (comment) (ok for dc from PT perspective) PT Visit Diagnosis: Other abnormalities of gait and mobility (R26.89)     Time: 4098-1191 PT Time Calculation (min) (ACUTE ONLY): 13 min  Charges:  $Gait Training: 8-22 mins                      Jerolyn Center, PT Acute Rehabilitation Services  Office [phone]    Zena Amos 11/29/2022, 10:28 AM

## 2022-11-29 NOTE — Progress Notes (Signed)
Patient seen with Dr. Corliss Skains today.   He is standing in the room, in his own clothe, ready to go home.  Spouse at bedside.   Reports that right eye vision is slightly better, his gait/balance is little off but he walked with PT w/o issues.  Spouse reports that right  groin site is still bruised but no issues.  Patient is a/o, arms are equally warm, right eye with central vision defect.   Patient was instructed to take blood thinners, drink plenty of water, no stooping, bending, lifting more than 10 pounds for 2 weeks.  He will be scheduled for follow up visit with Dr. Corliss Skains in 2-3 weeks from today.    Eris Hannan H Valisha Heslin PA-C 11/29/2022 1:28 PM

## 2022-11-30 LAB — POCT ACTIVATED CLOTTING TIME
Activated Clotting Time: 201 seconds
Activated Clotting Time: 206 seconds

## 2022-12-13 ENCOUNTER — Ambulatory Visit (HOSPITAL_COMMUNITY)
Admission: RE | Admit: 2022-12-13 | Discharge: 2022-12-13 | Disposition: A | Discharge status: Home / Self Care | Payer: Medicare Other | Source: Ambulatory Visit | Attending: Student | Admitting: Student

## 2022-12-13 DIAGNOSIS — G459 Transient cerebral ischemic attack, unspecified: Secondary | ICD-10-CM

## 2022-12-13 DIAGNOSIS — H539 Unspecified visual disturbance: Secondary | ICD-10-CM

## 2022-12-14 HISTORY — PX: IR RADIOLOGIST EVAL & MGMT: IMG5224

## 2023-01-03 ENCOUNTER — Encounter: Payer: Self-pay | Admitting: Adult Health

## 2023-01-03 ENCOUNTER — Ambulatory Visit: Payer: Medicare Other | Admitting: Adult Health

## 2023-01-03 VITALS — BP 142/58 | HR 52 | Ht 65.0 in | Wt 137.8 lb

## 2023-01-03 DIAGNOSIS — I708 Atherosclerosis of other arteries: Secondary | ICD-10-CM | POA: Diagnosis not present

## 2023-01-03 DIAGNOSIS — H3411 Central retinal artery occlusion, right eye: Secondary | ICD-10-CM

## 2023-01-03 MED ORDER — CLOPIDOGREL BISULFATE 75 MG PO TABS: 75.0000 mg | ORAL_TABLET | Freq: Every day | ORAL | 3 refills | Status: AC

## 2023-01-03 NOTE — Patient Instructions (Signed)
Your Plan:  Continue ASA and Plavix  Blood pressure goal <130/90 Cholesterol LDL goal <70 Diabetes goal A1c <7 Will Follow-up with Dr. Corliss Skains in 4-6 months  Monitor diet and try to exercise   Thank you for coming to see [place] at [facility]. I hope we have been able to provide you high quality care today.  You may receive a patient satisfaction survey over the next few weeks. We would appreciate your feedback and comments so that we may continue to improve ourselves and the health of our patients.

## 2023-01-03 NOTE — Progress Notes (Addendum)
PATIENT: Steve Mcgrath DOB: 09-29-1926  REASON FOR VISIT: follow up HISTORY FROM: patient PRIMARY NEUROLOGIST: Dr. Pearlean Brownie  Chief Complaint  Patient presents with   Hospitalization Follow-up    Rm 19. Hospital follow up.     HISTORY OF PRESENT ILLNESS: Today 01/03/23  Steve Mcgrath is a 89 y.o. male who is here today for hospital follow-up due to R CRAO.   Patient presented to the emergency room on 423 with right-sided visual loss, blurry vision and a central dark spot.  He was found to have severe stenosis at the brachiocephalic origin and Moderate stenosis of the vertebral Artery.  426 he underwent angiography and balloon angiography of the right innominate artery with improvement in flow in the right subclavian and right common carotid.  He reports that some of his vision has returned in the right eye.  He states that it is blurry across the middle but he can see objects.  No longer having hallucinations.  He remains on aspirin and Plavix.  HISTORY Steve Mcgrath is a 89 y.o. male with history of ignificant of CAD with CABG, IIDM, CKD stage II, hypothyroidism, presented with transient vision loss.    R CRAO: Likely due to large vessel disease from symptomatic brachiocephalic origin stenosis CT head No acute abnormality.  CTA head & neck - no LVO.  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  Heparin IV stopped 4/26. Therapy recommendations: none Disposition: Pending  REVIEW OF SYSTEMS: Out of a complete 14 system review of symptoms, the  patient complains only of the following symptoms, and all other reviewed systems are negative.  ALLERGIES: No Known Allergies  HOME MEDICATIONS: Outpatient Medications Prior to Visit  Medication Sig Dispense Refill   alendronate (FOSAMAX) 70 MG tablet Take by mouth.     amLODipine (NORVASC) 10 MG tablet Take 1 tablet by mouth daily.     aspirin EC 81 MG tablet Take 1 tablet by mouth daily.     atorvastatin (LIPITOR) 80 MG tablet Take 1 tablet (80 mg total) by mouth at bedtime. 30 tablet 2   calcium carbonate (SUPER CALCIUM) 1500 (600 Ca) MG TABS tablet Take by mouth daily.     carvedilol (COREG) 6.25 MG tablet Take 1 tablet (6.25 mg total) by mouth 2 (two) times daily with a meal. 60 tablet 0   cholecalciferol (VITAMIN D3) 25 MCG (1000 UNIT) tablet Take 1,000 Units by mouth daily.     cyanocobalamin (VITAMIN B12) 1000 MCG tablet Take 1,000 mcg by mouth daily.     levothyroxine (SYNTHROID) 100 MCG tablet Take 100 mcg by mouth daily before breakfast.     omeprazole (PRILOSEC) 40 MG capsule Take 1 capsule by mouth daily.     tamsulosin (FLOMAX) 0.4 MG CAPS capsule Take 1 capsule (0.4 mg total) by mouth daily. 30 capsule    No facility-administered medications prior to visit.    PAST MEDICAL HISTORY: Past Medical History:  Diagnosis Date   CAD (coronary artery disease)    DM (diabetes mellitus) (HCC)  Enlarged prostate    Hypothyroid    Kidney stones    MI, acute, non ST segment elevation (HCC)    Murmur     PAST SURGICAL HISTORY: Past Surgical History:  Procedure Laterality Date   CORONARY ARTERY BYPASS GRAFT     IR ANGIOGRAM EXTREMITY RIGHT  11/26/2022   IR CT HEAD LTD  11/26/2022   IR RADIOLOGIST EVAL & MGMT  12/14/2022   IR STENT PLACEMENT ANTE CAROTID INC ANGIO  11/26/2022   RADIOLOGY WITH ANESTHESIA N/A 11/26/2022   Procedure: Cerebral angioplasty with stenting;  Surgeon: Julieanne Cotton, MD;  Location: [facility] OR;  Service: Radiology;  Laterality: N/A;   TONSILLECTOMY       FAMILY HISTORY: No family history on file.  SOCIAL HISTORY: Social History   Socioeconomic History   Marital status: Married    Spouse name: Not on file   Number of children: Not on file   Years of education: Not on file   Highest education level: Not on file  Occupational History   Not on file  Tobacco Use   Smoking status: Former    Types: Cigarettes   Smokeless tobacco: Not on file  Substance and Sexual Activity   Alcohol use: Never   Drug use: Not on file   Sexual activity: Not on file  Other Topics Concern   Not on file  Social History Narrative   Not on file   Social Determinants of Health   Financial Resource Strain: Not on file  Food Insecurity: Patient Declined (11/23/2022)   Hunger Vital Sign    Worried About Running Out of Food in the Last Year: Patient declined    Ran Out of Food in the Last Year: Patient declined  Transportation Needs: No Transportation Needs (11/23/2022)   PRAPARE - Administrator, Civil Service (Medical): No    Lack of Transportation (Non-Medical): No  Physical Activity: Not on file  Stress: Not on file  Social Connections: Not on file  Intimate Partner Violence: Not on file      PHYSICAL EXAM  Vitals:   01/03/23 1004 01/03/23 1038  BP: (!) 132/46 (!) 142/58  Pulse: (!) 53 (!) 52  Weight: 137 lb 12.8 oz (62.5 kg)   Height: 5\' 5"  (1.651 m)    Body mass index is 22.93 kg/m.  Generalized: Well developed, in no acute distress   Neurological examination  Mentation: Alert oriented to time, place, history taking. Follows all commands speech and language fluent Cranial nerve II-XII: Pupils were equal round reactive to light. Extraocular movements were full, visual field were full on confrontational test.  Reported blurry vision in the right eye.  Facial sensation and strength were normal.Head turning and shoulder shrug  were normal and symmetric. Motor: The motor testing reveals 5 over 5 strength of all 4  extremities. Good symmetric motor tone is noted throughout.  Sensory: Sensory testing is intact to soft touch on all 4 extremities. No evidence of extinction is noted.  Coordination: Cerebellar testing reveals good finger-nose-finger and heel-to-shin bilaterally.  Gait and station: Gait is normal.  Reflexes: Deep tendon reflexes are symmetric and normal bilaterally.   DIAGNOSTIC DATA (LABS, IMAGING, TESTING) - I reviewed patient records, labs, notes, testing and imaging myself where available.  Lab Results  Component Value Date   WBC 4.7 11/28/2022   HGB 10.7 (L) 11/28/2022   HCT 30.6 (L) 11/28/2022   MCV 88.4 11/28/2022   PLT 114 (L) 11/28/2022  Component Value Date/Time   NA 133 (L) 11/28/2022 0450   K 3.7 11/28/2022 0450   CL 110 11/28/2022 0450   CO2 18 (L) 11/28/2022 0450   GLUCOSE 108 (H) 11/28/2022 0450   BUN 41 (H) 11/28/2022 0450   CREATININE 1.33 (H) 11/28/2022 0450   CALCIUM 7.8 (L) 11/28/2022 0450   PROT 7.0 11/22/2022 1601   ALBUMIN 3.9 11/22/2022 1601   AST 18 11/22/2022 1601   ALT 14 11/22/2022 1601   ALKPHOS 64 11/22/2022 1601   BILITOT 0.4 11/22/2022 1601   GFRNONAA 50 (L) 11/28/2022 0450   Lab Results  Component Value Date   CHOL 166 11/24/2022   HDL 49 11/24/2022   LDLCALC 102 (H) 11/24/2022   TRIG 75 11/24/2022   CHOLHDL 3.4 11/24/2022   Lab Results  Component Value Date   HGBA1C 6.0 (H) 11/24/2022      ASSESSMENT AND PLAN 89 y.o. year old male  has a past medical history of CAD (coronary artery disease), DM (diabetes mellitus) (HCC), Enlarged prostate, Hypothyroid, Kidney stones, MI, acute, non ST segment elevation (HCC), and Murmur. here with:   R CRAO: Likely due to large vessel disease from symptomatic brachiocephalic origin stenosis   Continue aspirin 81 mg daily and clopidogrel 75 mg daily for secondary stroke prevention.  Discussed secondary stroke prevention measures and importance of close PCP follow up for aggressive stroke risk  factor management. I have gone over the pathophysiology of stroke, warning signs and symptoms, risk factors and their management in some detail with instructions to go to the closest emergency room for symptoms of concern. HTN: BP goal <130/90.   HLD: LDL goal <70. Recent LDL 102.  DMII: A1c goal<7.0. Recent A1c 6.  FU with IR- Dr. Corliss Skains in 4-6 months  Encouraged patient to monitor diet and encouraged exercise FU with our office 6 months     Butch Penny, MSN, NP-C 01/03/2023, 9:52 AM [facility] [address], [address] [place], [place] [address] [phone]

## 2023-01-03 NOTE — Progress Notes (Signed)
I agree with the above plan 

## 2023-02-04 ENCOUNTER — Telehealth (HOSPITAL_COMMUNITY): Payer: Self-pay | Admitting: Interventional Radiology

## 2023-02-04 NOTE — Progress Notes (Signed)
Patient ID: DAYVION SLUIS, male   DOB: 11/17/1926, 89 y.o.   MRN: [id number]  Addendum to procedure note of 11/30/2022.    Date of examination 11/26/2022.  The fifth paragraph under the endovascular revascularization  should read as follows . A 7 mm x 15 mm palmaz  balloon mountable stent was advanced over the 03 5 inch Terumo advantage guidewire to the proximal innominate artery.  The proximal and distal markers of the balloon were aligned across the tight stenosis.  A control inflation was performed using a micro inflation syringe device via micro tubing.  The balloon was inflated for approximately 2 minutes.  Following deflation and proximal retrieval of the balloon, a control arteriogram performed through the balloon guide catheter demonstrated significantly improved caliber and flow through the angioplasty at segment.  An arteriogram performed approximately 10 minutes later continued to demonstrate improved caliber at the angioplastied site.  Due to the extensive calcification of the aortic arch, and in the innominate artery origin spot fluoroscopic images were obtained which did not reveal a stent like  tubular structure at the site of the angioplasty.  Intermittent fluoroscopy performed from the aortic arch to the common femoral arteries did not show evidence of a stent in these vessels either. The stent was found to be on the patient's drape.  Remainder of the dictation as per previously.  Fatima Sanger MD.

## 2023-02-15 ENCOUNTER — Emergency Department (HOSPITAL_BASED_OUTPATIENT_CLINIC_OR_DEPARTMENT_OTHER)
Admission: EM | Admit: 2023-02-15 | Discharge: 2023-02-15 | Disposition: A | Discharge status: Home / Self Care | Payer: Medicare Other | Attending: Emergency Medicine | Admitting: Emergency Medicine

## 2023-02-15 ENCOUNTER — Emergency Department (HOSPITAL_BASED_OUTPATIENT_CLINIC_OR_DEPARTMENT_OTHER): Payer: Medicare Other

## 2023-02-15 DIAGNOSIS — S63284A Dislocation of proximal interphalangeal joint of right ring finger, initial encounter: Secondary | ICD-10-CM | POA: Insufficient documentation

## 2023-02-15 DIAGNOSIS — E039 Hypothyroidism, unspecified: Secondary | ICD-10-CM | POA: Insufficient documentation

## 2023-02-15 DIAGNOSIS — Z7982 Long term (current) use of aspirin: Secondary | ICD-10-CM | POA: Diagnosis not present

## 2023-02-15 DIAGNOSIS — S41111A Laceration without foreign body of right upper arm, initial encounter: Secondary | ICD-10-CM

## 2023-02-15 DIAGNOSIS — S63259A Unspecified dislocation of unspecified finger, initial encounter: Secondary | ICD-10-CM

## 2023-02-15 DIAGNOSIS — Z7902 Long term (current) use of antithrombotics/antiplatelets: Secondary | ICD-10-CM | POA: Diagnosis not present

## 2023-02-15 DIAGNOSIS — I2510 Atherosclerotic heart disease of native coronary artery without angina pectoris: Secondary | ICD-10-CM | POA: Insufficient documentation

## 2023-02-15 DIAGNOSIS — E119 Type 2 diabetes mellitus without complications: Secondary | ICD-10-CM | POA: Diagnosis not present

## 2023-02-15 DIAGNOSIS — S41112A Laceration without foreign body of left upper arm, initial encounter: Secondary | ICD-10-CM | POA: Insufficient documentation

## 2023-02-15 DIAGNOSIS — W11XXXA Fall on and from ladder, initial encounter: Secondary | ICD-10-CM | POA: Insufficient documentation

## 2023-02-15 DIAGNOSIS — S20211A Contusion of right front wall of thorax, initial encounter: Secondary | ICD-10-CM | POA: Diagnosis not present

## 2023-02-15 DIAGNOSIS — Z23 Encounter for immunization: Secondary | ICD-10-CM | POA: Insufficient documentation

## 2023-02-15 DIAGNOSIS — S6991XA Unspecified injury of right wrist, hand and finger(s), initial encounter: Secondary | ICD-10-CM | POA: Diagnosis present

## 2023-02-15 DIAGNOSIS — Z79899 Other long term (current) drug therapy: Secondary | ICD-10-CM | POA: Insufficient documentation

## 2023-02-15 LAB — BASIC METABOLIC PANEL
Anion gap: 9 (ref 5–15)
BUN: 32 mg/dL — ABNORMAL HIGH (ref 8–23)
CO2: 22 mmol/L (ref 22–32)
Calcium: 8.7 mg/dL — ABNORMAL LOW (ref 8.9–10.3)
Chloride: 104 mmol/L (ref 98–111)
Creatinine, Ser: 1.14 mg/dL (ref 0.61–1.24)
GFR, Estimated: 60 mL/min — ABNORMAL LOW (ref >60)
Glucose, Bld: 115 mg/dL — ABNORMAL HIGH (ref 70–99)
Potassium: 4.1 mmol/L (ref 3.5–5.1)
Sodium: 135 mmol/L (ref 135–145)

## 2023-02-15 LAB — CBC WITH DIFFERENTIAL/PLATELET
Abs Immature Granulocytes: 0.02 10*3/uL (ref 0.00–0.07)
Basophils Absolute: 0 10*3/uL (ref 0.0–0.1)
Basophils Relative: 1 %
Eosinophils Absolute: 0.2 10*3/uL (ref 0.0–0.5)
Eosinophils Relative: 4 %
HCT: 33.8 % — ABNORMAL LOW (ref 39.0–52.0)
Hemoglobin: 11.1 g/dL — ABNORMAL LOW (ref 13.0–17.0)
Immature Granulocytes: 0 %
Lymphocytes Relative: 24 %
Lymphs Abs: 1.4 10*3/uL (ref 0.7–4.0)
MCH: 29.5 pg (ref 26.0–34.0)
MCHC: 32.8 g/dL (ref 30.0–36.0)
MCV: 89.9 fL (ref 80.0–100.0)
Monocytes Absolute: 0.6 10*3/uL (ref 0.1–1.0)
Monocytes Relative: 11 %
Neutro Abs: 3.4 10*3/uL (ref 1.7–7.7)
Neutrophils Relative %: 60 %
Platelets: 171 10*3/uL (ref 150–400)
RBC: 3.76 MIL/uL — ABNORMAL LOW (ref 4.22–5.81)
RDW: 13.8 % (ref 11.5–15.5)
WBC: 5.7 10*3/uL (ref 4.0–10.5)
nRBC: 0 % (ref 0.0–0.2)

## 2023-02-15 LAB — PROTIME-INR
INR: 1.1 (ref 0.8–1.2)
Prothrombin Time: 14.6 seconds (ref 11.4–15.2)

## 2023-02-15 MED ORDER — TETANUS-DIPHTH-ACELL PERTUSSIS 5-2.5-18.5 LF-MCG/0.5 IM SUSY
0.5000 mL | PREFILLED_SYRINGE | Freq: Once | INTRAMUSCULAR | Status: AC
  Administered 2023-02-15: 0.5 mL via INTRAMUSCULAR
  Filled 2023-02-15: qty 0.5

## 2023-02-15 MED ORDER — FENTANYL CITRATE PF 50 MCG/ML IJ SOSY
50.0000 ug | PREFILLED_SYRINGE | Freq: Once | INTRAMUSCULAR | Status: AC
  Administered 2023-02-15: 50 ug via INTRAVENOUS
  Filled 2023-02-15: qty 1

## 2023-02-15 MED ORDER — LIDOCAINE 5 % EX PTCH: 1.0000 | MEDICATED_PATCH | CUTANEOUS | 0 refills | Status: AC

## 2023-02-15 NOTE — ED Triage Notes (Addendum)
Pt arrives with c/o fall that happened today. Per family, pt fell off of a step ladder in the house. Pt c/o right finger pain, right rib cage, and right arm pain. Pt takes plavix and aspirin. Pt denies hitting his head and or neck pain. Pt does have a deformity to right ring finger. Pt has skin tear on right arm as well. Pt also has a significant hematoma to right rib cage area. Pt denies CP or SOB.

## 2023-02-15 NOTE — ED Provider Notes (Signed)
[facility] Provider Note   CSN: [id number] Arrival date & time: 02/15/23  1552     History  Chief Complaint  Patient presents with   Steve Mcgrath is a 89 y.o. male.   Fall   Patient has a history of coronary disease, MI, diabetes, kidney stones, hypothyroidism.  Patient presents to the ED for evaluation after fall.  Patient was on a stepladder.  He was 2 steps up when he was reaching for something ended up losing his balance and falling off the stepladder.  Patient landed on his right rib cage.  He also injured his finger and his right elbow.  Patient denies any headache or head injury.  He does not have any neck pain.  He did not lose consciousness.  He denies any numbness or weakness.  Patient is primarily having pain in his right posterior ribs.  He has noticed an area of bruising.  He also injured his right ring finger and it appears deformed.  He sustained some superficial lacerations to his right elbow but that does not that tender patient was able to walk without difficulty    Home Medications Prior to Admission medications   Medication Sig Start Date End Date Taking? Authorizing Provider  lidocaine (LIDODERM) 5 % Place 1 patch onto the skin daily. Remove & Discard patch within 12 hours or as directed by MD 02/15/23  Yes Linwood Dibbles, MD  amLODipine (NORVASC) 10 MG tablet Take 1 tablet by mouth daily. 09/27/22 11/04/23  [provider]  aspirin EC 81 MG tablet Take 1 tablet by mouth daily. 08/17/16   [provider]  atorvastatin (LIPITOR) 80 MG tablet Take 1 tablet (80 mg total) by mouth at bedtime. 11/29/22 01/03/23  Dorcas Carrow, MD  carvedilol (COREG) 6.25 MG tablet Take 1 tablet (6.25 mg total) by mouth 2 (two) times daily with a meal. 11/29/22 01/03/23  Dorcas Carrow, MD  cholecalciferol (VITAMIN D3) 25 MCG (1000 UNIT) tablet Take 1,000 Units by mouth daily. 10/13/18   [provider]  clopidogrel  (PLAVIX) 75 MG tablet Take 1 tablet (75 mg total) by mouth daily. 01/03/23   Butch Penny, NP  cyanocobalamin (VITAMIN B12) 1000 MCG tablet Take 1,000 mcg by mouth daily. 07/31/21   [provider]  levothyroxine (SYNTHROID) 100 MCG tablet Take 100 mcg by mouth daily before breakfast. 08/05/22   [provider]  omeprazole (PRILOSEC) 40 MG capsule Take 1 capsule by mouth daily. 12/15/21   [provider]  tamsulosin (FLOMAX) 0.4 MG CAPS capsule Take 1 capsule (0.4 mg total) by mouth daily. 11/30/22   Dorcas Carrow, MD      Allergies    Patient has no known allergies.    Review of Systems   Review of Systems  Physical Exam Updated Vital Signs BP (!) 130/50   Pulse (!) 48   Temp 97.8 F (36.6 C)   Resp 20   SpO2 93%  Physical Exam Vitals and nursing note reviewed.  Constitutional:      Appearance: He is well-developed. He is not diaphoretic.  HENT:     Head: Normocephalic and atraumatic.     Right Ear: External ear normal.     Left Ear: External ear normal.  Eyes:     General: No scleral icterus.       Right eye: No discharge.        Left eye: No discharge.  Conjunctiva/sclera: Conjunctivae normal.  Neck:     Trachea: No tracheal deviation.  Cardiovascular:     Rate and Rhythm: Normal rate and regular rhythm.  Pulmonary:     Effort: Pulmonary effort is normal. No respiratory distress.     Breath sounds: Normal breath sounds. No stridor. No wheezing or rales.  Chest:     Chest wall: Tenderness present. No crepitus.     Comments: Bruising noted right posterior rib cage, tenderness palpation, hematoma palpated Abdominal:     General: Bowel sounds are normal. There is no distension.     Palpations: Abdomen is soft.     Tenderness: There is no abdominal tenderness. There is no guarding or rebound.  Musculoskeletal:        General: No tenderness or deformity.     Cervical back: Normal and neck supple.     Thoracic back: Normal.     Lumbar back:  Normal.     Comments: Deformed right ring finger, elbow full range of motion without tenderness, superficial lacerations/skin tears noted right elbow  Skin:    General: Skin is warm and dry.     Findings: No rash.  Neurological:     General: No focal deficit present.     Mental Status: He is alert.     Cranial Nerves: No cranial nerve deficit, dysarthria or facial asymmetry.     Sensory: No sensory deficit.     Motor: No abnormal muscle tone or seizure activity.     Coordination: Coordination normal.  Psychiatric:        Mood and Affect: Mood normal.     ED Results / Procedures / Treatments   Labs (all labs ordered are listed, but only abnormal results are displayed) Labs Reviewed  CBC WITH DIFFERENTIAL/PLATELET - Abnormal; Notable for the following components:      Result Value   RBC 3.76 (*)    Hemoglobin 11.1 (*)    HCT 33.8 (*)    All other components within normal limits  BASIC METABOLIC PANEL - Abnormal; Notable for the following components:   Glucose, Bld 115 (*)    BUN 32 (*)    Calcium 8.7 (*)    GFR, Estimated 60 (*)    All other components within normal limits  PROTIME-INR    EKG None  Radiology DG Finger Ring Right  Result Date: 02/15/2023 CLINICAL DATA:  Post reduction EXAM: RIGHT RING FINGER 2+V COMPARISON:  02/15/2023 FINDINGS: Interval reduction of fourth digit PIP dislocation. Suspected punctate fracture fragments adjacent to the base of middle phalanx/head of proximal phalanx. IMPRESSION: Interval reduction of fourth digit PIP dislocation. Suspected punctate fracture fragments at the level of the PIP joint. Electronically Signed   By: Jasmine Pang M.D.   On: 02/15/2023 19:05   DG Ribs Unilateral Right  Result Date: 02/15/2023 CLINICAL DATA:  Fall with rib pain EXAM: RIGHT RIBS - 2 VIEW COMPARISON:  02/15/2023 FINDINGS: No fracture or other bone lesions are seen involving the ribs. IMPRESSION: Negative. Electronically Signed   By: Jasmine Pang M.D.    On: 02/15/2023 17:07   DG Chest 2 View  Result Date: 02/15/2023 CLINICAL DATA:  Fall EXAM: CHEST - 2 VIEW COMPARISON:  CT chest 04/14/2021, chest x-ray 03/07/2018 FINDINGS: Post sternotomy changes. No acute airspace disease or effusion. Stable cardiomediastinal silhouette with aortic atherosclerosis. Mild chronic bronchitic changes and hyperinflation. IMPRESSION: No active cardiopulmonary disease. Mild chronic bronchitic changes and hyperinflation. Electronically Signed   By: Adrian Prows.D.  On: 02/15/2023 17:06   DG Finger Ring Right  Result Date: 02/15/2023 CLINICAL DATA:  Finger pain after falling from a ladder. EXAM: RIGHT RING FINGER 2+V COMPARISON:  None Available. FINDINGS: The ring finger is dislocated in a posterior and ulnar direction at the proximal interphalangeal joint. No evidence of acute fracture. There are underlying interphalangeal degenerative changes within the visualized fingers. Intraosseous cysts are present within the triquetrum and distal radius. IMPRESSION: Dislocation of the ring finger at the proximal interphalangeal joint. Electronically Signed   By: Carey Bullocks M.D.   On: 02/15/2023 17:04    Procedures .Marland KitchenLaceration Repair  Date/Time: 02/15/2023 5:58 PM  Performed by: Linwood Dibbles, MD Authorized by: Linwood Dibbles, MD   Consent:    Consent obtained:  Verbal   Consent given by:  Patient Universal protocol:    Patient identity confirmed:  Verbally with patient Anesthesia:    Anesthesia method:  None Laceration details:    Length (cm):  8 Treatment:    Amount of cleaning:  Standard   Debridement:  None Skin repair:    Repair method:  Steri-Strips and tissue adhesive   Number of Steri-Strips:  6 Approximation:    Approximation:  Close Repair type:    Repair type:  Simple Post-procedure details:    Dressing:  Open (no dressing)   Procedure completion:  Tolerated well, no immediate complications .Ortho Injury Treatment  Date/Time: 02/15/2023 5:59  PM  Performed by: Linwood Dibbles, MD Authorized by: Linwood Dibbles, MD   Consent:    Consent obtained:  Verbal   Alternatives discussed:  No treatmentPre-procedure neurovascular assessment: neurovascularly intact Pre-procedure distal perfusion: normal Pre-procedure neurological function: normal Pre-procedure range of motion: reduced  Anesthesia: Local anesthesia used: no  Patient sedated: NoImmobilization: splint Splint type: finger. Splint Applied by: ED Nurse Post-procedure neurovascular assessment: post-procedure neurovascularly intact Post-procedure distal perfusion: normal Post-procedure neurological function: normal Post-procedure range of motion: normal       Medications Ordered in ED Medications  fentaNYL (SUBLIMAZE) injection 50 mcg (50 mcg Intravenous Given 02/15/23 1729)  Tdap (BOOSTRIX) injection 0.5 mL (0.5 mLs Intramuscular Given 02/15/23 1732)    ED Course/ Medical Decision Making/ A&P Clinical Course as of 02/15/23 1911  Tue Feb 15, 2023  1718 CBC with Differential(!) BC shows stable hemoglobin, INR normal.  Metabolic panel normal [JK]  1718 Rib x-rays without signs of fracture [JK]  1719 PIP dislocation right ring finger [JK]  1910 X-rays show interval reduction.  Punctate fracture fragments noted status post reduction [JK]    Clinical Course User Index [JK] Linwood Dibbles, MD                             Medical Decision Making Problems Addressed: Contusion of rib on right side, initial encounter: acute illness or injury that poses a threat to life or bodily functions Dislocation of finger, initial encounter: acute illness or injury that poses a threat to life or bodily functions Laceration of right upper extremity, initial encounter: acute illness or injury that poses a threat to life or bodily functions  Amount and/or Complexity of Data Reviewed Labs: ordered. Decision-making details documented in ED Course. Radiology: ordered.  Risk Prescription drug  management.   Patient presented to the ED for evaluation after a fall.  Patient noted to have contusion to his right ribs as well as deformity of his finger and a superficial laceration to his right elbow area.  Patient without significant bony  tenderness with the exception of his ring finger and his rib area.  No findings to suggest head injury.  No signs of blunt abdominal trauma.  No respiratory difficulty to suggest significant chest injury.  X-rays do not show any signs of fracture or pneumothorax.  Consistent with rib contusion although cannot exclude an occult rib fracture.  Patient's dislocation of his finger was reduced without difficulty in the ED.  Splint applied.  Steri-Strips and Dermabond was applied to the superficial skin tear of his right elbow area. Evaluation and diagnostic testing in the emergency department does not suggest an emergent condition requiring admission or immediate intervention beyond what has been performed at this time.  The patient is safe for discharge and has been instructed to return immediately for worsening symptoms, change in symptoms or any other concerns.         Final Clinical Impression(s) / ED Diagnoses Final diagnoses:  Contusion of rib on right side, initial encounter  Dislocation of finger, initial encounter  Laceration of right upper extremity, initial encounter    Rx / DC Orders ED Discharge Orders          Ordered    lidocaine (LIDODERM) 5 %  Every 24 hours        02/15/23 1854              Linwood Dibbles, MD 02/15/23 1911

## 2023-02-15 NOTE — Discharge Instructions (Addendum)
Apply ice to your finger to help with swelling.  Take Tylenol or ibuprofen as needed for pain.  Apply the lidocaine patches to your rib contusion.  Follow-up with your orthopedic doctor for further evaluation of your finger injury.  The skin glue and Steri-Strips on your arm will eventually peel off on their own

## 2023-03-17 ENCOUNTER — Other Ambulatory Visit (HOSPITAL_COMMUNITY): Payer: Self-pay | Admitting: Interventional Radiology

## 2023-03-17 DIAGNOSIS — G459 Transient cerebral ischemic attack, unspecified: Secondary | ICD-10-CM

## 2023-03-29 ENCOUNTER — Ambulatory Visit (HOSPITAL_COMMUNITY)
Admission: RE | Admit: 2023-03-29 | Discharge: 2023-03-29 | Disposition: A | Discharge status: Home / Self Care | Payer: Medicare Other | Source: Ambulatory Visit | Attending: Interventional Radiology | Admitting: Interventional Radiology

## 2023-03-29 DIAGNOSIS — G459 Transient cerebral ischemic attack, unspecified: Secondary | ICD-10-CM | POA: Insufficient documentation

## 2023-03-29 MED ORDER — SODIUM CHLORIDE (PF) 0.9 % IJ SOLN
INTRAMUSCULAR | Status: AC
  Filled 2023-03-29: qty 50

## 2023-03-29 MED ORDER — IOHEXOL 350 MG/ML SOLN
75.0000 mL | Freq: Once | INTRAVENOUS | Status: AC | PRN
  Administered 2023-03-29: 75 mL via INTRAVENOUS

## 2023-04-20 ENCOUNTER — Telehealth (HOSPITAL_COMMUNITY): Payer: Self-pay

## 2023-04-20 NOTE — Telephone Encounter (Signed)
Pt agreed to continue f/u with neurology. AB

## 2023-04-20 NOTE — Telephone Encounter (Signed)
-----   Message from Hoyt Koch sent at 04/20/2023  2:35 PM EDT ----- Per Dr. Corliss Skains, no change in imaging since angioplasty.  No further needs with IR.  Recommend ongoing follow-up with Neurology.   Lannette Donath

## 2023-06-04 IMAGING — CT CT ANGIO CHEST
2 of 12 series · 17 of 36 positions shown · IV contrast (omnipaque)
Comparison: None.

CLINICAL DATA: Positive D-dimer, dizziness and weakness.

EXAM:
CT ANGIOGRAPHY CHEST WITH CONTRAST
TECHNIQUE: Multidetector CT imaging of the chest was performed using the
standard protocol during bolus administration of intravenous
contrast. Multiplanar CT image reconstructions and MIPs were
obtained to evaluate the vascular anatomy.
CONTRAST:  75mL OMNIPAQUE IOHEXOL 350 MG/ML SOLN

[Series 10: pe thins · axial · 0.71mm/px · z∈[+1165,+1414]mm · 16 of 283 slices shown]
[im 17/283  lung]
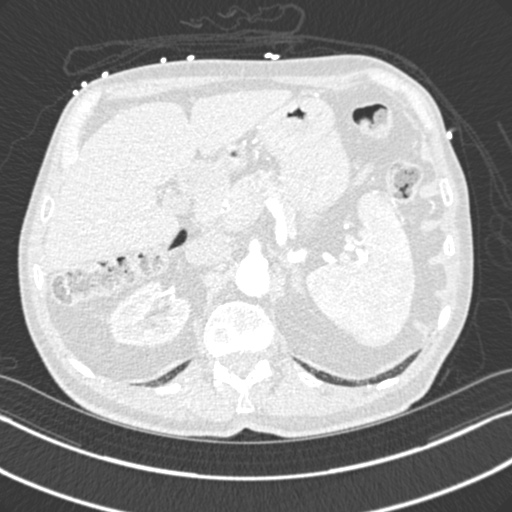
[im 34/283  mediastinal]
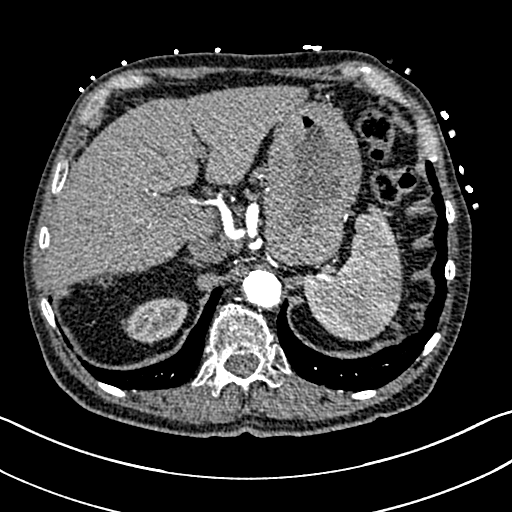
[im 50/283  lung]
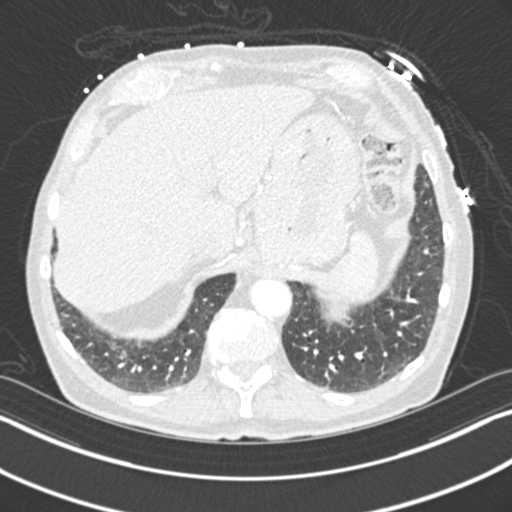
[im 67/283  mediastinal]
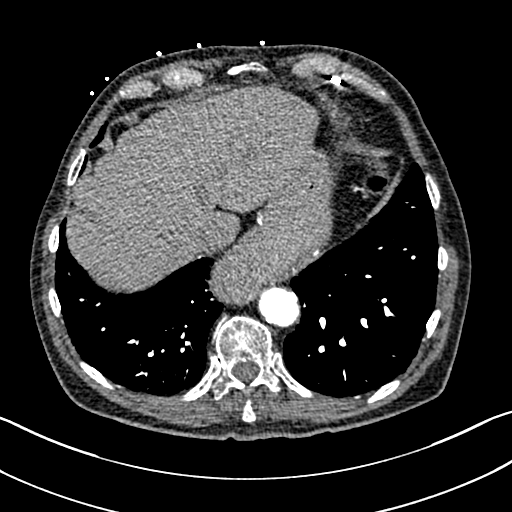
[im 83/283  lung]
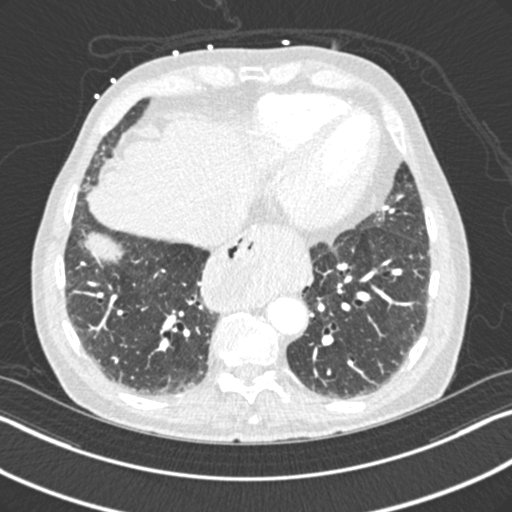
[im 100/283  mediastinal]
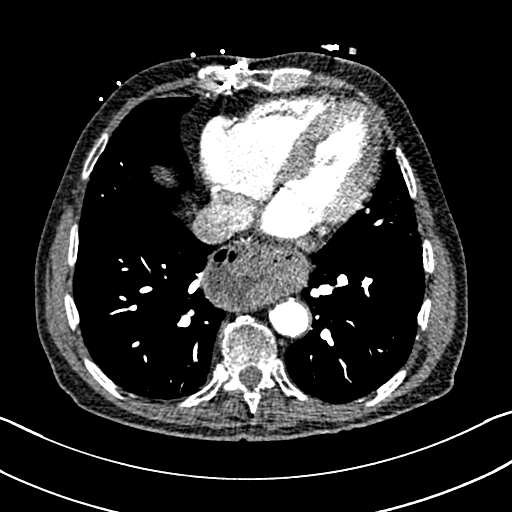
[im 117/283  lung]
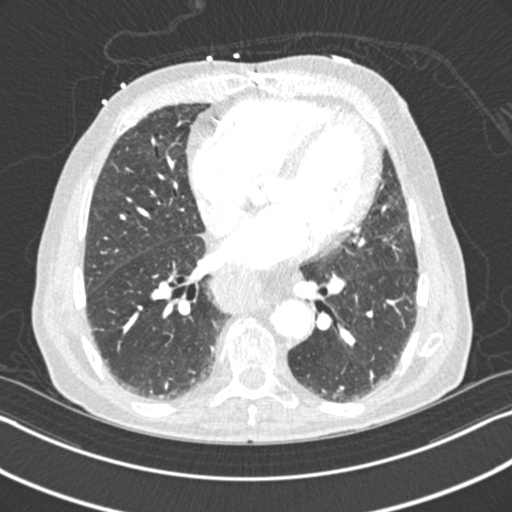
[im 133/283  mediastinal]
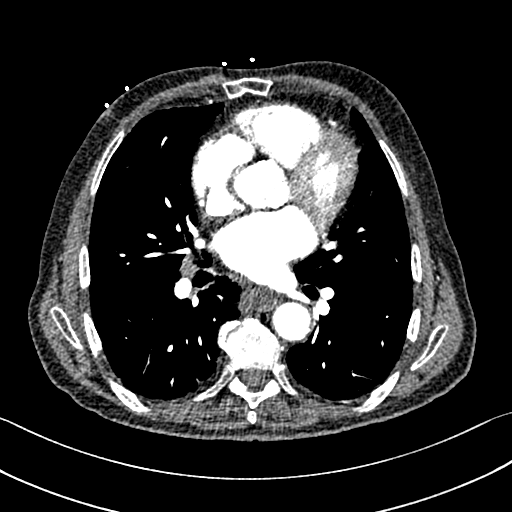
[im 150/283  lung]
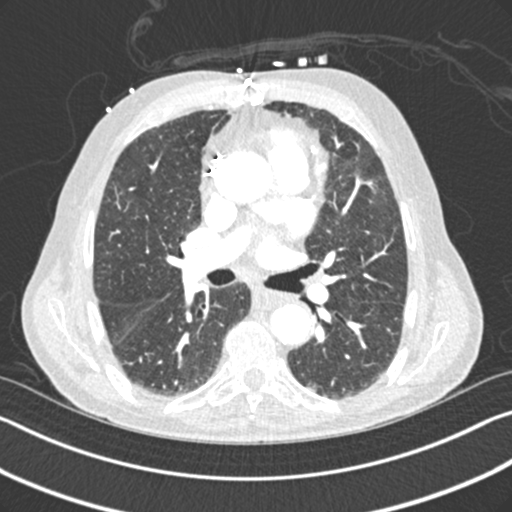
[im 166/283  mediastinal]
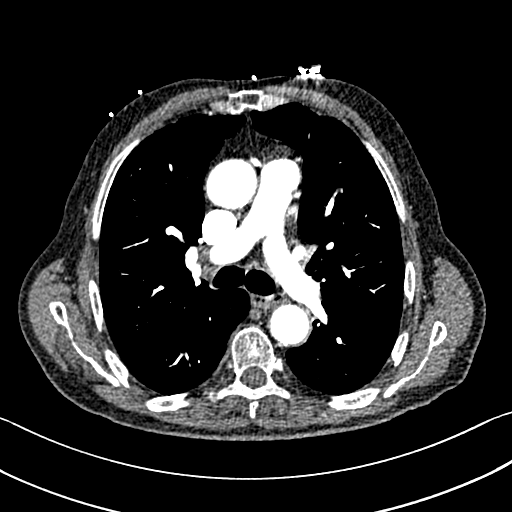
[im 183/283  lung]
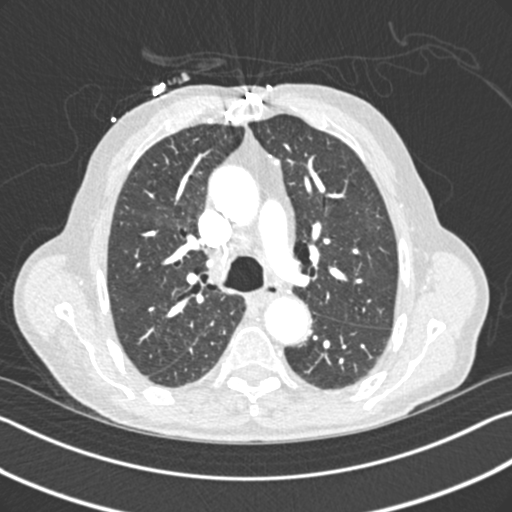
[im 200/283  mediastinal]
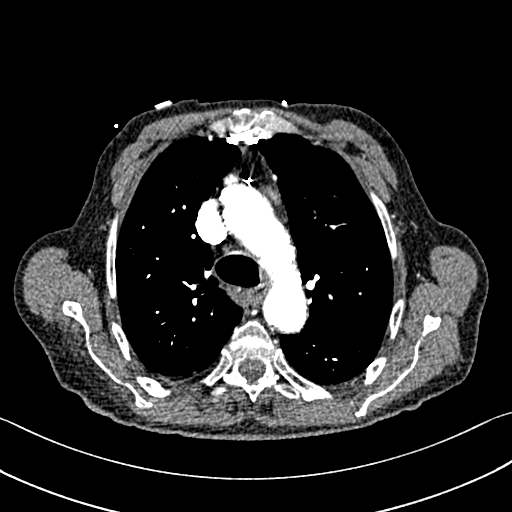
[im 216/283  lung]
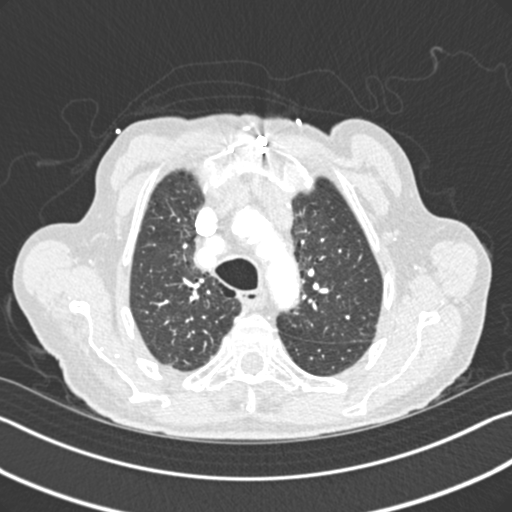
[im 233/283  mediastinal]
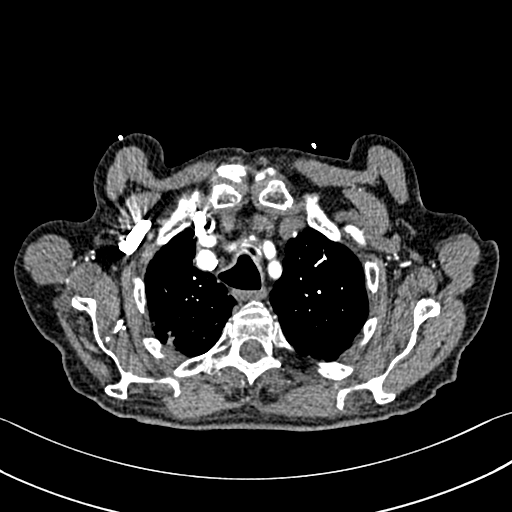
[im 249/283  lung]
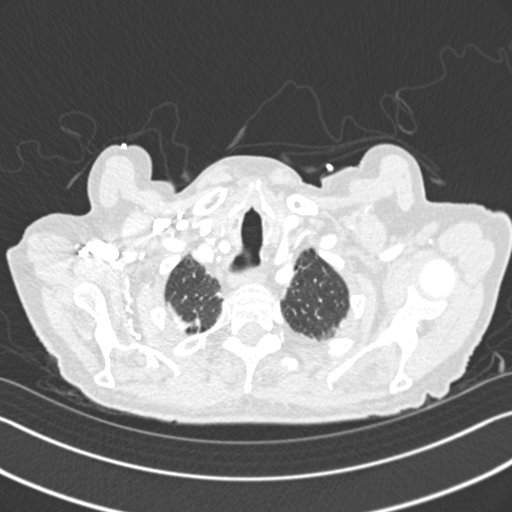
[im 266/283  mediastinal]
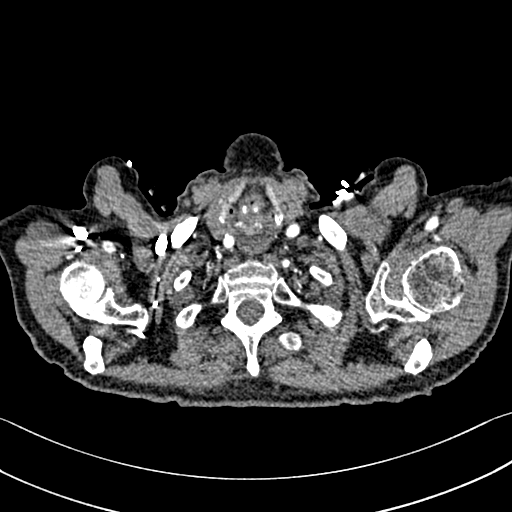

[Series 11: pe coronal mpr · coronal · 0.57mm/px · 1 of 142 slices shown]
[im 71/142  mediastinal]
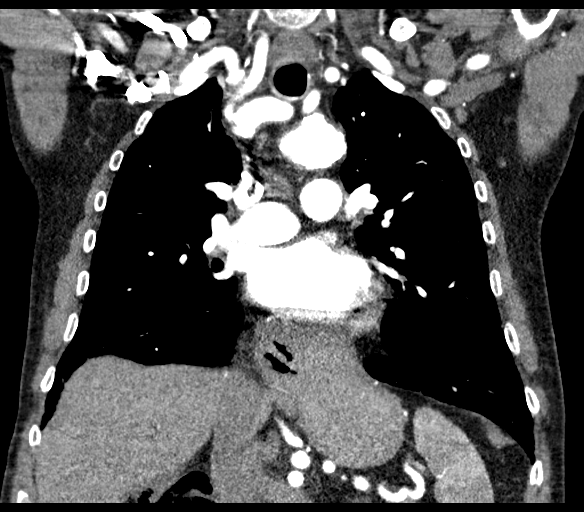

[17 of 36 positions shown; findings below may reference images not displayed]

FINDINGS: Cardiovascular: Satisfactory opacification of the pulmonary arteries
to the segmental level. No evidence of pulmonary embolism. The heart
is mildly enlarged. There is no pericardial effusion. Patient is
status post cardiac surgery. The thoracic aorta is ectatic. There is
calcified atherosclerotic disease throughout the aorta.

Mediastinum/Nodes: There are no enlarged mediastinal or hilar lymph
nodes identified. There is a moderate-sized hiatal hernia. The
visualized esophagus is nondilated.

Lungs/Pleura: There are mild emphysematous changes bilaterally.
There is pleuroparenchymal scarring in both lung apices. There is no
focal lung infiltrate. There is no pleural effusion or pneumothorax.

Upper Abdomen: No acute abnormality.

Musculoskeletal: Sternotomy wires are present. Degenerative changes
affect the spine.

Review of the MIP images confirms the above findings.
IMPRESSION: 1. No evidence for pulmonary embolism.
2. Mild cardiomegaly.
3. Moderate-sized hiatal hernia.
4. Aortic Atherosclerosis ([id number]) and Emphysema ([id number]).

## 2023-08-08 ENCOUNTER — Ambulatory Visit: Payer: Medicare Other | Admitting: Adult Health
